# Patient Record
Sex: Male | Born: 1965 | Race: Black or African American | Hispanic: No | Marital: Single | Smoking: Former smoker
Health system: Southern US, Community
[De-identification: ages and names within clinical notes are randomized; demographics above are authoritative.]

## PROBLEM LIST (undated history)

## (undated) DIAGNOSIS — I10 Essential (primary) hypertension: Secondary | ICD-10-CM

## (undated) DIAGNOSIS — E785 Hyperlipidemia, unspecified: Secondary | ICD-10-CM

## (undated) DIAGNOSIS — Z87891 Personal history of nicotine dependence: Secondary | ICD-10-CM

## (undated) DIAGNOSIS — Z8042 Family history of malignant neoplasm of prostate: Secondary | ICD-10-CM

## (undated) DIAGNOSIS — T300 Burn of unspecified body region, unspecified degree: Secondary | ICD-10-CM

## (undated) HISTORY — DX: Hyperlipidemia, unspecified: E78.5

## (undated) HISTORY — DX: Family history of malignant neoplasm of prostate: Z80.42

## (undated) HISTORY — DX: Personal history of nicotine dependence: Z87.891

## (undated) HISTORY — DX: Burn of unspecified body region, unspecified degree: T30.0

## (undated) HISTORY — PX: SKIN GRAFT: SHX250

---

## 2002-03-22 ENCOUNTER — Emergency Department (HOSPITAL_COMMUNITY): Admission: EM | Admit: 2002-03-22 | Discharge: 2002-03-23 | Payer: Self-pay | Admitting: Emergency Medicine

## 2002-03-23 ENCOUNTER — Encounter: Payer: Self-pay | Admitting: Emergency Medicine

## 2002-03-26 ENCOUNTER — Emergency Department (HOSPITAL_COMMUNITY): Admission: EM | Admit: 2002-03-26 | Discharge: 2002-03-26 | Payer: Self-pay | Admitting: Emergency Medicine

## 2003-07-10 ENCOUNTER — Emergency Department (HOSPITAL_COMMUNITY): Admission: EM | Admit: 2003-07-10 | Discharge: 2003-07-10 | Payer: Self-pay | Admitting: Emergency Medicine

## 2004-08-11 ENCOUNTER — Emergency Department (HOSPITAL_COMMUNITY): Admission: EM | Admit: 2004-08-11 | Discharge: 2004-08-11 | Payer: Self-pay | Admitting: *Deleted

## 2005-09-09 ENCOUNTER — Emergency Department (HOSPITAL_COMMUNITY): Admission: EM | Admit: 2005-09-09 | Discharge: 2005-09-09 | Payer: Self-pay | Admitting: Emergency Medicine

## 2005-09-19 ENCOUNTER — Ambulatory Visit: Payer: Self-pay | Admitting: Family Medicine

## 2005-09-20 ENCOUNTER — Ambulatory Visit: Payer: Self-pay | Admitting: *Deleted

## 2012-09-12 ENCOUNTER — Encounter (HOSPITAL_COMMUNITY): Payer: Self-pay | Admitting: *Deleted

## 2012-09-12 ENCOUNTER — Emergency Department (HOSPITAL_COMMUNITY)
Admission: EM | Admit: 2012-09-12 | Discharge: 2012-09-12 | Disposition: A | Discharge status: Home / Self Care | Payer: BC Managed Care – PPO | Attending: Emergency Medicine | Admitting: Emergency Medicine

## 2012-09-12 DIAGNOSIS — R221 Localized swelling, mass and lump, neck: Secondary | ICD-10-CM | POA: Insufficient documentation

## 2012-09-12 DIAGNOSIS — R220 Localized swelling, mass and lump, head: Secondary | ICD-10-CM | POA: Insufficient documentation

## 2012-09-12 DIAGNOSIS — K047 Periapical abscess without sinus: Secondary | ICD-10-CM

## 2012-09-12 DIAGNOSIS — K029 Dental caries, unspecified: Secondary | ICD-10-CM | POA: Insufficient documentation

## 2012-09-12 DIAGNOSIS — F17200 Nicotine dependence, unspecified, uncomplicated: Secondary | ICD-10-CM | POA: Insufficient documentation

## 2012-09-12 DIAGNOSIS — I10 Essential (primary) hypertension: Secondary | ICD-10-CM | POA: Insufficient documentation

## 2012-09-12 DIAGNOSIS — J3489 Other specified disorders of nose and nasal sinuses: Secondary | ICD-10-CM | POA: Insufficient documentation

## 2012-09-12 DIAGNOSIS — R0981 Nasal congestion: Secondary | ICD-10-CM

## 2012-09-12 HISTORY — DX: Essential (primary) hypertension: I10

## 2012-09-12 MED ORDER — FLUTICASONE PROPIONATE 50 MCG/ACT NA SUSP: 2.0000 | Freq: Every day | NASAL | Status: DC

## 2012-09-12 MED ORDER — HYDROCODONE-ACETAMINOPHEN 5-325 MG PO TABS: 1.0000 | ORAL_TABLET | ORAL | Status: DC | PRN

## 2012-09-12 MED ORDER — PENICILLIN V POTASSIUM 500 MG PO TABS: 500.0000 mg | ORAL_TABLET | Freq: Four times a day (QID) | ORAL | Status: AC

## 2012-09-12 NOTE — ED Notes (Signed)
Pt c/o pain in nasal passages; states used nasal spray yesterday and woke up with increased swelling and pain upper lip

## 2012-09-12 NOTE — ED Provider Notes (Signed)
History    This chart was scribed for a non-physician practitioner working with Hurman Horn, MD by Jiles Prows, ED scribe. This patient was seen in room WA08/WA08 and the patient's care was started at 11:22 PM.  CSN: [id number] Arrival date & time 09/12/12  2259   Chief Complaint  Patient presents with  . Facial Swelling  . Dental Pain   The history is provided by the patient, medical records and the spouse. No language interpreter was used.   HPI Comments: Joel Allen is a 47 y.o. male who presents to the Emergency Department complaining of upper lip and nasal swelling since yesterday.  Reports that he used an OTC nasal spray for congestion yesterday before going to bed, and when he woke up today, his nose, upper lip and gums felt swollen and painful.  Pt reports he has a few upper teeth that are "bad" and need to come out.  Not currently established with a dentist.  No fevers, sweats, chills.  Has taken percocet without relief of sx.  Past Medical History  Diagnosis Date  . Hypertension    History reviewed. No pertinent past surgical history. No family history on file. History  Substance Use Topics  . Smoking status: Current Every Day Smoker -- 0.50 packs/day    Types: Cigarettes  . Smokeless tobacco: Not on file  . Alcohol Use: No    Review of Systems  HENT: Positive for facial swelling.   All other systems reviewed and are negative.    Allergies  Review of patient's allergies indicates no known allergies.  Home Medications  No current outpatient prescriptions on file.  BP 172/101  Pulse 60  Temp(Src) 98.1 F (36.7 C)  Resp 20  SpO2 98%  Physical Exam  Nursing note and vitals reviewed. Constitutional: He is oriented to person, place, and time. He appears well-developed and well-nourished.  Poor dentition.  HENT:  Head: Normocephalic and atraumatic. No trismus in the jaw.  Right Ear: Tympanic membrane, external ear and ear canal normal.  Left Ear: Tympanic  membrane, external ear and ear canal normal.  Nose: Mucosal edema present.  Mouth/Throat: Uvula is midline, oropharynx is clear and moist and mucous membranes are normal. No oral lesions. Abnormal dentition. Dental abscesses and dental caries present. No edematous. No oropharyngeal exudate, posterior oropharyngeal edema, posterior oropharyngeal erythema or tonsillar abscesses.  Turbinates swollen and erythematous; teeth largely in poor dentition, both central canines broken with dental carries and dental abscesses present; no oropharyngeal edema or erythema, no PTA, airway patent, handling secretions appropriately, speaking in fully complete sentences without difficulty  Eyes: Conjunctivae and EOM are normal. Pupils are equal, round, and reactive to light.  Neck: Normal range of motion. Neck supple.  Cardiovascular: Normal rate, regular rhythm and normal heart sounds.   Pulmonary/Chest: Effort normal and breath sounds normal. No respiratory distress.  Musculoskeletal: Normal range of motion.  Neurological: He is alert and oriented to person, place, and time.  Skin: Skin is warm and dry.  Psychiatric: He has a normal mood and affect.    ED Course  Procedures (including critical care time) DIAGNOSTIC STUDIES: Oxygen Saturation is 98% on RA, normal by my interpretation.    COORDINATION OF CARE: 11:25 PM - Discussed ED treatment with pt at bedside including pain management, nasal spray, antibiotics, and follow up with dentist for tooth extraction and pt agrees.   Labs Reviewed - No data to display No results found.  1. Dental abscess   2. Nasal  congestion     MDM   Both central canines with severe dental carries and dental abscesses which is causing pain and swelling of upper gums/lip- do not believe it is due to the nasal spray.  Turbinates are swollen and erythematous consistent with nasal congestion-- no facial swelling or airway compromise.  Rx flonase, pen VK, vicodin.  FU with  dentist- Dr. Leanord Asal.  Discussed plan with pt, he agreed.  Return precautions advised.  I personally performed the services described in this documentation, which was scribed in my presence. The recorded information has been reviewed and is accurate.    Garlon Hatchet, PA-C 09/12/12 2358

## 2012-09-13 NOTE — ED Provider Notes (Signed)
Medical screening examination/treatment/procedure(s) were performed by non-physician practitioner and as supervising physician I was immediately available for consultation/collaboration.  Hurman Horn, MD 09/13/12 [phone]

## 2012-11-01 ENCOUNTER — Encounter (HOSPITAL_COMMUNITY): Payer: Self-pay | Admitting: *Deleted

## 2012-11-01 ENCOUNTER — Emergency Department (HOSPITAL_COMMUNITY)
Admission: EM | Admit: 2012-11-01 | Discharge: 2012-11-01 | Disposition: A | Discharge status: Home / Self Care | Payer: BC Managed Care – PPO | Attending: Emergency Medicine | Admitting: Emergency Medicine

## 2012-11-01 DIAGNOSIS — T3111 Burns involving 10-19% of body surface with 10-19% third degree burns: Secondary | ICD-10-CM | POA: Insufficient documentation

## 2012-11-01 DIAGNOSIS — T2027XA Burn of second degree of neck, initial encounter: Secondary | ICD-10-CM | POA: Insufficient documentation

## 2012-11-01 DIAGNOSIS — F17200 Nicotine dependence, unspecified, uncomplicated: Secondary | ICD-10-CM | POA: Insufficient documentation

## 2012-11-01 DIAGNOSIS — Z23 Encounter for immunization: Secondary | ICD-10-CM | POA: Insufficient documentation

## 2012-11-01 DIAGNOSIS — T3110 Burns involving 10-19% of body surface with 0% to 9% third degree burns: Secondary | ICD-10-CM | POA: Insufficient documentation

## 2012-11-01 DIAGNOSIS — W401XXA Explosion of explosive gases, initial encounter: Secondary | ICD-10-CM | POA: Insufficient documentation

## 2012-11-01 DIAGNOSIS — T22219A Burn of second degree of unspecified forearm, initial encounter: Secondary | ICD-10-CM | POA: Insufficient documentation

## 2012-11-01 DIAGNOSIS — Y929 Unspecified place or not applicable: Secondary | ICD-10-CM | POA: Insufficient documentation

## 2012-11-01 DIAGNOSIS — Y9389 Activity, other specified: Secondary | ICD-10-CM | POA: Insufficient documentation

## 2012-11-01 DIAGNOSIS — I10 Essential (primary) hypertension: Secondary | ICD-10-CM | POA: Insufficient documentation

## 2012-11-01 MED ORDER — SILVER SULFADIAZINE 1 % EX CREA
TOPICAL_CREAM | Freq: Once | CUTANEOUS | Status: AC
  Administered 2012-11-01: 1 via TOPICAL
  Filled 2012-11-01: qty 50

## 2012-11-01 MED ORDER — TETANUS-DIPHTH-ACELL PERTUSSIS 5-2.5-18.5 LF-MCG/0.5 IM SUSP
0.5000 mL | Freq: Once | INTRAMUSCULAR | Status: AC
  Administered 2012-11-01: 0.5 mL via INTRAMUSCULAR
  Filled 2012-11-01: qty 0.5

## 2012-11-01 MED ORDER — SILVER SULFADIAZINE 1 % EX CREA: TOPICAL_CREAM | Freq: Once | CUTANEOUS | Status: DC

## 2012-11-01 MED ORDER — HYDROCODONE-ACETAMINOPHEN 5-325 MG PO TABS: 1.0000 | ORAL_TABLET | Freq: Four times a day (QID) | ORAL | Status: DC | PRN

## 2012-11-01 MED ORDER — HYDROMORPHONE HCL PF 1 MG/ML IJ SOLN
1.0000 mg | INTRAMUSCULAR | Status: DC | PRN
  Administered 2012-11-01: 1 mg via INTRAMUSCULAR
  Filled 2012-11-01: qty 1
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
## 2012-11-01 NOTE — Progress Notes (Signed)
Patient confirms that she has [facility] but does not have a pcp.  Patient reports that [employer] has sent him a list of pcps but he has not had the oppurtunity to pick one yet and promises he will.  EDCM also informed patient that he can call the phone number on the back of his insurance card to help him find a doctor who is close to him and who is within network.  Patient verbalized understanding.  No further needs at this time.
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
## 2012-11-08 ENCOUNTER — Encounter (HOSPITAL_COMMUNITY): Payer: Self-pay | Admitting: Emergency Medicine

## 2012-11-08 ENCOUNTER — Emergency Department (HOSPITAL_COMMUNITY)
Admission: EM | Admit: 2012-11-08 | Discharge: 2012-11-08 | Disposition: A | Discharge status: Home / Self Care | Payer: BC Managed Care – PPO | Source: Home / Self Care

## 2012-11-08 DIAGNOSIS — T3111 Burns involving 10-19% of body surface with 10-19% third degree burns: Secondary | ICD-10-CM

## 2012-11-08 MED ORDER — SILVER SULFADIAZINE 1 % EX CREA: TOPICAL_CREAM | Freq: Every day | CUTANEOUS | Status: DC

## 2012-11-08 MED ORDER — HYDROCODONE-ACETAMINOPHEN 5-325 MG PO TABS: 1.0000 | ORAL_TABLET | Freq: Four times a day (QID) | ORAL | Status: DC | PRN
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
## 2012-11-10 NOTE — ED Provider Notes (Signed)
Medical screening examination/treatment/procedure(s) were performed by a resident physician or non-physician practitioner and as the supervising physician I was immediately available for consultation/collaboration.  Jahara Dail, MD   Renda Pohlman S Edman Lipsey, MD 11/10/12 0816 

## 2012-11-13 DIAGNOSIS — L299 Pruritus, unspecified: Secondary | ICD-10-CM | POA: Insufficient documentation

## 2012-11-13 DIAGNOSIS — F17200 Nicotine dependence, unspecified, uncomplicated: Secondary | ICD-10-CM | POA: Insufficient documentation

## 2012-11-13 DIAGNOSIS — T3110 Burns involving 10-19% of body surface with 0% to 9% third degree burns: Secondary | ICD-10-CM | POA: Insufficient documentation

## 2013-06-26 ENCOUNTER — Emergency Department (HOSPITAL_COMMUNITY)
Admission: EM | Admit: 2013-06-26 | Discharge: 2013-06-26 | Disposition: A | Discharge status: Home / Self Care | Payer: BC Managed Care – PPO | Source: Home / Self Care | Attending: Emergency Medicine | Admitting: Emergency Medicine

## 2013-06-26 ENCOUNTER — Encounter (HOSPITAL_COMMUNITY): Payer: Self-pay | Admitting: Emergency Medicine

## 2013-06-26 DIAGNOSIS — L280 Lichen simplex chronicus: Secondary | ICD-10-CM

## 2013-06-26 MED ORDER — BETAMETHASONE DIPROPIONATE AUG 0.05 % EX CREA: TOPICAL_CREAM | Freq: Two times a day (BID) | CUTANEOUS | Status: DC

## 2013-06-26 MED ORDER — HYDROXYZINE HCL 25 MG PO TABS: 25.0000 mg | ORAL_TABLET | Freq: Four times a day (QID) | ORAL | Status: DC

## 2013-06-26 NOTE — Discharge Instructions (Signed)
Eczema has been described as "the itch that rashes."  The primary problem is inflammation of the skin, this is followed by the irresistible urge to scratch.  The scratching is what causes the rash.   ° °Eczema is thought to be hereditary.  It is often accompanied by other inflammatory diseases such as asthma or hay fever.  There are certain environmental things that may make it worse such as dryness of the skin, wool clothing, infection, certain allergens, and stress.  It is important to recognize that it is not caused by stress, and the search for an allergic cause is not helpful. ° °While there is no cure for eczema, it can be controlled with prescription medication and lifestyle changes. Suggestions follow for successful control of eczema: ° °· Avoid harsh soaps.  Use Dove, Cetaphil, Neutrogena, Aveeno.  Do not use Ivory. °· Take infrequent baths or showers, use luke warm water.  Get in and out of the bath or shower as quickly as possible.  Do not scrub the skin. Pat the skin dry after bathing.   °· Immediately after bathing apply a skin moisturizer such as Aquaphor, Vaseline, Eucerin, Cetaphil, Nutraderm, or Nivea. These should be applied 2 times a day, immediately after bathing and after all hand washing.  °· Use unscented laundry detergent such as Cheer-free, All-free, or unscented Bounce since these have no perfumes or preservatives.  °· Keep thermostat as low as possible in winter.  Consider a humidifier in bedroom.   °· Avoid wool clothing, blended or synthetic fabric or shirt collar tags--use  100% cotton clothing instead.  °· Avoid scratching.  Try using an ice cube on  Itchy area.  °· May use antihistamines such as Benadryl, Zyrtec, Allegra, or Claritin for itching.  °· Treat skin infections immediately. °· Eczema is a chronic and recurring condition.  You should be followed by a primary care doctor or a dermatology specialist or both.  ° ° ° °

## 2013-06-26 NOTE — ED Notes (Signed)
Called [facility] on [address] to cancel meds Dr. Lorenz CoasterKeller E-prescribed Per pt, wants meds to go to [facility] ([address]); called med in
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
## 2013-06-26 NOTE — ED Notes (Signed)
Pt c/o rash on both arms, chest onset x1 month Denies f/v/n/d  Alert w/no signs of acute distress.

## 2015-03-18 ENCOUNTER — Encounter: Payer: Self-pay | Admitting: Medical

## 2015-03-18 ENCOUNTER — Ambulatory Visit (INDEPENDENT_AMBULATORY_CARE_PROVIDER_SITE_OTHER): Payer: BLUE CROSS/BLUE SHIELD | Admitting: Medical

## 2015-03-18 VITALS — BP 150/100 | HR 81 | Wt 151.0 lb

## 2015-03-18 DIAGNOSIS — F17200 Nicotine dependence, unspecified, uncomplicated: Secondary | ICD-10-CM | POA: Diagnosis not present

## 2015-03-18 DIAGNOSIS — R51 Headache: Secondary | ICD-10-CM

## 2015-03-18 DIAGNOSIS — R351 Nocturia: Secondary | ICD-10-CM

## 2015-03-18 DIAGNOSIS — G479 Sleep disorder, unspecified: Secondary | ICD-10-CM | POA: Diagnosis not present

## 2015-03-18 DIAGNOSIS — I10 Essential (primary) hypertension: Secondary | ICD-10-CM | POA: Diagnosis not present

## 2015-03-18 DIAGNOSIS — R3581 Nocturnal polyuria: Secondary | ICD-10-CM

## 2015-03-18 DIAGNOSIS — R519 Headache, unspecified: Secondary | ICD-10-CM

## 2015-03-18 DIAGNOSIS — R0602 Shortness of breath: Secondary | ICD-10-CM | POA: Diagnosis not present

## 2015-03-18 DIAGNOSIS — Z8042 Family history of malignant neoplasm of prostate: Secondary | ICD-10-CM | POA: Diagnosis not present

## 2015-03-18 LAB — CBC
HEMATOCRIT: 48.2 % (ref 39.0–52.0)
HEMOGLOBIN: 16 g/dL (ref 13.0–17.0)
MCH: 26.7 pg (ref 26.0–34.0)
MCHC: 33.2 g/dL (ref 30.0–36.0)
MCV: 80.3 fL (ref 78.0–100.0)
MPV: 10.1 fL (ref 8.6–12.4)
Platelets: 280 10*3/uL (ref 150–400)
RBC: 6 MIL/uL — ABNORMAL HIGH (ref 4.22–5.81)
RDW: 14.8 % (ref 11.5–15.5)
WBC: 6.6 10*3/uL (ref 4.0–10.5)

## 2015-03-18 LAB — POCT URINALYSIS DIPSTICK
Bilirubin, UA: NEGATIVE
Blood, UA: NEGATIVE
GLUCOSE UA: NEGATIVE
Ketones, UA: NEGATIVE
LEUKOCYTES UA: NEGATIVE
Nitrite, UA: NEGATIVE
PROTEIN UA: NEGATIVE
Spec Grav, UA: >=1.03
UROBILINOGEN UA: NEGATIVE
pH, UA: 5

## 2015-03-18 LAB — COMPREHENSIVE METABOLIC PANEL
ALBUMIN: 4.2 g/dL (ref 3.6–5.1)
ALT: 16 U/L (ref 9–46)
AST: 18 U/L (ref 10–35)
Alkaline Phosphatase: 102 U/L (ref 40–115)
BUN: 15 mg/dL (ref 7–25)
CHLORIDE: 106 mmol/L (ref 98–110)
CO2: 26 mmol/L (ref 20–31)
CREATININE: 0.94 mg/dL (ref 0.70–1.33)
Calcium: 9.5 mg/dL (ref 8.6–10.3)
Glucose, Bld: 80 mg/dL (ref 65–99)
POTASSIUM: 3.9 mmol/L (ref 3.5–5.3)
SODIUM: 141 mmol/L (ref 135–146)
TOTAL PROTEIN: 7.3 g/dL (ref 6.1–8.1)
Total Bilirubin: 0.5 mg/dL (ref 0.2–1.2)

## 2015-03-18 LAB — TSH: TSH: 0.94 u[IU]/mL (ref 0.350–4.500)

## 2015-03-18 NOTE — Progress Notes (Signed)
Subjective: Chief Complaint  Patient presents with  . New Patient (Initial Visit)    taking nugenix, said it doesnt help. said that he is short of breath but thinks it is from smoking  . Headache    couple weeks. taken bc powders for it.    Here as a new patient.  Was going to urgent care prior.  Been having headaches the last 2 weeks.   Sometimes gets up to use the bathroom in the middle of the night and will have a bad headache.  Wonders about his prostate making him get up to urinate.  Was taking some OTC Beta Prostate.  Took this for a few months, then stopped this.   He notes if standing at a ball game, will gets some mid to low back pain, sometimes will cause his eyes to get red.    Has had back issues like this for years.  If he walks a mile, gets a lot of back pain.  Currently a smoker, 0.5 ppd.  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  BP is elevated today, and he notes that he doesn't check BPs now but has used [facility] BP machine in the past but not recently.  Prior to 2 weeks ago was having periodic headaches, but lately having a lot of headaches.  Doesn't necessarily eat but 1-2 times daily.  getting headaches most days per week, twice daily.   Uses some BC powders.   Gets headaches frontal.  Doesn't sleep well.  Has used some Zquil.  No witnessed apnea or snoring.  Does feel a lot of fatigue lately.  Not sleeping well of late.   Gets to sleep ok, but awakes often at night due to urination.  On average gets up once nightly to urinate.  During the day urine is ok, but in the wee hours of the night, has to push and strain for urine to come out.   Wants to quit tobacco. Not drinking much caffeine.  Does occasionally get headache that awakes him from sleep.   No headaches with activity or sex.  His mother passed away in Jan 04, 2023 with lymphoma.  No other aggravating or relieving factors. No other complaint.  Lives with fiance.     Past Medical History  Diagnosis  Date  . Hypertension    ROS as in subjective   Objective: BP 150/100 mmHg  Pulse 81  Wt 151 lb (68.493 kg)  General appearance: alert, no distress, WD/WN, lean AA male HEENT: normocephalic, sclerae anicteric, PERRLA, EOMi, nares patent, no discharge or erythema, pharynx normal Oral cavity: MMM, left upper molar with decay, missing several upper teeth Neck: supple, no lymphadenopathy, no thyromegaly, no masses, no bruits Heart: RRR, normal S1, S2, no murmurs Lungs: CTA bilaterally, no wheezes, rhonchi, or rales Abdomen: +bs, soft, no bruits, non tender, non distended, no masses, no hepatomegaly, no splenomegaly Extremities: no edema, no cyanosis, no clubbing Pulses: 2+ symmetric, upper and lower extremities, normal cap refill Neurological: alert, oriented x 3, CN2-12 intact, strength normal upper extremities and lower extremities, sensation normal throughout, DTRs 2+ throughout, no cerebellar signs, gait normal Psychiatric: normal affect, behavior normal, pleasant    Adult ECG Report  Indication: SOB, elevated BP  Rate: 68 bpm  Rhythm: normal sinus rhythm  QRS Axis: 51 degrees  PR Interval:  QRS Duration: 86ms  QTc:  Conduction Disturbances: minimal voltage criteria for LVH  Other Abnormalities: none  Patient's cardiac  risk factors are: hypertension, male gender and smoking/ tobacco exposure.  EKG comparison: none  Narrative Interpretation: possible LVH, otherwise normal EKG    Assessment: Encounter Diagnoses  Name Primary?  . Essential hypertension Yes  . Acute intractable headache, unspecified headache type   . Tobacco use disorder   . Sleep disturbance   . Nocturnal polyuria   . SOB (shortness of breath)   . Family history of prostate cancer      Plan: Discussed his many symptoms and concerns, most importantly headaches and SOB.   Discussed differential for his symptoms, some may or may not be related.  EKG reviewed.  Labs today, will send for CXR.     Will likely need to do PFTs next visit, prostate exam.   F/u pending studies.

## 2015-03-19 ENCOUNTER — Ambulatory Visit
Admission: RE | Admit: 2015-03-19 | Discharge: 2015-03-19 | Disposition: A | Discharge status: Home / Self Care | Payer: BLUE CROSS/BLUE SHIELD | Source: Ambulatory Visit | Attending: Medical | Admitting: Medical

## 2015-03-19 DIAGNOSIS — R351 Nocturia: Secondary | ICD-10-CM

## 2015-03-19 DIAGNOSIS — R51 Headache: Secondary | ICD-10-CM

## 2015-03-19 DIAGNOSIS — R3581 Nocturnal polyuria: Secondary | ICD-10-CM

## 2015-03-19 DIAGNOSIS — I10 Essential (primary) hypertension: Secondary | ICD-10-CM

## 2015-03-19 DIAGNOSIS — R0602 Shortness of breath: Secondary | ICD-10-CM

## 2015-03-19 DIAGNOSIS — G479 Sleep disorder, unspecified: Secondary | ICD-10-CM

## 2015-03-19 DIAGNOSIS — F17200 Nicotine dependence, unspecified, uncomplicated: Secondary | ICD-10-CM

## 2015-03-19 DIAGNOSIS — R519 Headache, unspecified: Secondary | ICD-10-CM

## 2015-03-19 LAB — PSA: PSA: 0.23 ng/mL (ref <=4.00)

## 2015-03-20 ENCOUNTER — Other Ambulatory Visit: Payer: Self-pay | Admitting: Medical

## 2015-04-01 ENCOUNTER — Encounter: Payer: Self-pay | Admitting: Medical

## 2015-04-01 ENCOUNTER — Ambulatory Visit (INDEPENDENT_AMBULATORY_CARE_PROVIDER_SITE_OTHER): Payer: BLUE CROSS/BLUE SHIELD | Admitting: Medical

## 2015-04-01 VITALS — BP 128/90 | HR 91 | Ht 70.0 in | Wt 147.0 lb

## 2015-04-01 DIAGNOSIS — I10 Essential (primary) hypertension: Secondary | ICD-10-CM

## 2015-04-01 DIAGNOSIS — Z8042 Family history of malignant neoplasm of prostate: Secondary | ICD-10-CM | POA: Diagnosis not present

## 2015-04-01 DIAGNOSIS — R0602 Shortness of breath: Secondary | ICD-10-CM

## 2015-04-01 DIAGNOSIS — Z0000 Encounter for general adult medical examination without abnormal findings: Secondary | ICD-10-CM | POA: Diagnosis not present

## 2015-04-01 DIAGNOSIS — F17200 Nicotine dependence, unspecified, uncomplicated: Secondary | ICD-10-CM

## 2015-04-01 DIAGNOSIS — Z125 Encounter for screening for malignant neoplasm of prostate: Secondary | ICD-10-CM | POA: Insufficient documentation

## 2015-04-01 DIAGNOSIS — Z113 Encounter for screening for infections with a predominantly sexual mode of transmission: Secondary | ICD-10-CM | POA: Diagnosis not present

## 2015-04-01 DIAGNOSIS — Z23 Encounter for immunization: Secondary | ICD-10-CM

## 2015-04-01 DIAGNOSIS — R51 Headache: Secondary | ICD-10-CM

## 2015-04-01 DIAGNOSIS — Z1211 Encounter for screening for malignant neoplasm of colon: Secondary | ICD-10-CM | POA: Insufficient documentation

## 2015-04-01 DIAGNOSIS — Z1159 Encounter for screening for other viral diseases: Secondary | ICD-10-CM

## 2015-04-01 DIAGNOSIS — R519 Headache, unspecified: Secondary | ICD-10-CM

## 2015-04-01 LAB — POCT URINALYSIS DIPSTICK
BILIRUBIN UA: NEGATIVE
GLUCOSE UA: NEGATIVE
Ketones, UA: NEGATIVE
LEUKOCYTES UA: NEGATIVE
NITRITE UA: NEGATIVE
PH UA: 5
Protein, UA: NEGATIVE
Spec Grav, UA: >=1.03
UROBILINOGEN UA: NEGATIVE

## 2015-04-01 MED ORDER — MOMETASONE FUROATE 220 MCG/INH IN AEPB
1.0000 | INHALATION_SPRAY | Freq: Every day | RESPIRATORY_TRACT | Status: DC
Start: 2015-04-01 — End: 2015-05-06

## 2015-04-01 MED ORDER — LOSARTAN POTASSIUM 25 MG PO TABS
25.0000 mg | ORAL_TABLET | Freq: Every day | ORAL | Status: DC
Start: 2015-04-01 — End: 2015-05-06

## 2015-04-01 MED ORDER — MOMETASONE FUROATE 220 MCG/INH IN AEPB: 2.0000 | INHALATION_SPRAY | Freq: Every day | RESPIRATORY_TRACT | Status: DC

## 2015-04-01 MED ORDER — ALBUTEROL SULFATE 108 (90 BASE) MCG/ACT IN AEPB
2.0000 | INHALATION_SPRAY | Freq: Four times a day (QID) | RESPIRATORY_TRACT | Status: DC | PRN
Start: 2015-04-01 — End: 2015-05-06
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
## 2015-04-02 NOTE — Addendum Note (Signed)
Addended by: Kieth Brightly on: 04/02/2015 10:53 AM   Modules accepted: Kipp Brood

## 2015-04-10 ENCOUNTER — Other Ambulatory Visit: Payer: BLUE CROSS/BLUE SHIELD

## 2015-04-10 DIAGNOSIS — I10 Essential (primary) hypertension: Secondary | ICD-10-CM

## 2015-04-10 DIAGNOSIS — Z0000 Encounter for general adult medical examination without abnormal findings: Secondary | ICD-10-CM

## 2015-04-10 DIAGNOSIS — Z113 Encounter for screening for infections with a predominantly sexual mode of transmission: Secondary | ICD-10-CM

## 2015-04-10 DIAGNOSIS — Z1159 Encounter for screening for other viral diseases: Secondary | ICD-10-CM

## 2015-04-10 LAB — LIPID PANEL
CHOLESTEROL: 234 mg/dL — AB (ref 125–200)
HDL: 45 mg/dL (ref >=40)
LDL Cholesterol: 172 mg/dL — ABNORMAL HIGH (ref <130)
TRIGLYCERIDES: 84 mg/dL (ref <150)
Total CHOL/HDL Ratio: 5.2 Ratio — ABNORMAL HIGH (ref <=5.0)
VLDL: 17 mg/dL (ref <30)

## 2015-04-11 LAB — HEPATITIS C ANTIBODY: HCV AB: NEGATIVE

## 2015-04-11 LAB — GC/CHLAMYDIA PROBE AMP
CT Probe RNA: NOT DETECTED
GC PROBE AMP APTIMA: NOT DETECTED

## 2015-04-11 LAB — RPR

## 2015-04-11 LAB — HIV ANTIBODY (ROUTINE TESTING W REFLEX): HIV 1&2 Ab, 4th Generation: NONREACTIVE

## 2015-04-13 ENCOUNTER — Encounter: Payer: Self-pay | Admitting: Gastroenterology

## 2015-04-17 ENCOUNTER — Other Ambulatory Visit (INDEPENDENT_AMBULATORY_CARE_PROVIDER_SITE_OTHER): Payer: BLUE CROSS/BLUE SHIELD

## 2015-04-17 DIAGNOSIS — R3129 Other microscopic hematuria: Secondary | ICD-10-CM | POA: Diagnosis not present

## 2015-04-17 LAB — POCT URINALYSIS DIPSTICK
BILIRUBIN UA: NEGATIVE
Glucose, UA: NEGATIVE
KETONES UA: NEGATIVE
LEUKOCYTES UA: NEGATIVE
NITRITE UA: NEGATIVE
PH UA: 6
PROTEIN UA: NEGATIVE
RBC UA: NEGATIVE
Spec Grav, UA: >=1.03
Urobilinogen, UA: NEGATIVE

## 2015-05-06 ENCOUNTER — Ambulatory Visit (INDEPENDENT_AMBULATORY_CARE_PROVIDER_SITE_OTHER): Payer: BLUE CROSS/BLUE SHIELD | Admitting: Medical

## 2015-05-06 ENCOUNTER — Encounter: Payer: Self-pay | Admitting: Medical

## 2015-05-06 ENCOUNTER — Ambulatory Visit: Payer: Self-pay | Admitting: Medical

## 2015-05-06 ENCOUNTER — Telehealth: Payer: Self-pay

## 2015-05-06 VITALS — BP 120/80 | HR 86 | Wt 147.0 lb

## 2015-05-06 DIAGNOSIS — Z87891 Personal history of nicotine dependence: Secondary | ICD-10-CM | POA: Diagnosis not present

## 2015-05-06 DIAGNOSIS — R0602 Shortness of breath: Secondary | ICD-10-CM

## 2015-05-06 DIAGNOSIS — I10 Essential (primary) hypertension: Secondary | ICD-10-CM

## 2015-05-06 DIAGNOSIS — R519 Headache, unspecified: Secondary | ICD-10-CM

## 2015-05-06 DIAGNOSIS — R51 Headache: Secondary | ICD-10-CM

## 2015-05-06 MED ORDER — LOSARTAN POTASSIUM 25 MG PO TABS: 25.0000 mg | ORAL_TABLET | Freq: Every day | ORAL | Status: DC

## 2015-05-06 MED ORDER — MOMETASONE FUROATE 200 MCG/ACT IN AERO: 2.0000 | INHALATION_SPRAY | Freq: Every day | RESPIRATORY_TRACT | Status: DC
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
## 2015-05-06 NOTE — Telephone Encounter (Signed)
Pt is aware that he missed his appt and moved it to 10am

## 2015-05-11 ENCOUNTER — Ambulatory Visit (AMBULATORY_SURGERY_CENTER): Payer: BLUE CROSS/BLUE SHIELD | Admitting: *Deleted

## 2015-05-11 VITALS — Ht 71.0 in | Wt 148.0 lb

## 2015-05-11 DIAGNOSIS — Z1211 Encounter for screening for malignant neoplasm of colon: Secondary | ICD-10-CM

## 2015-05-11 NOTE — Progress Notes (Signed)
Patient denies any allergies to eggs or soy. Patient denies any problems with anesthesia/sedation. Patient denies any oxygen use at home and does not take any diet/weight loss medications. Patient declined EMMI education. 

## 2015-05-13 HISTORY — PX: COLONOSCOPY: SHX174

## 2015-05-25 ENCOUNTER — Encounter: Payer: Self-pay | Admitting: Gastroenterology

## 2015-05-25 ENCOUNTER — Ambulatory Visit (AMBULATORY_SURGERY_CENTER): Payer: BLUE CROSS/BLUE SHIELD | Admitting: Gastroenterology

## 2015-05-25 VITALS — BP 107/72 | HR 61 | Temp 97.8°F | Resp 10 | Ht 71.0 in | Wt 148.0 lb

## 2015-05-25 DIAGNOSIS — Z1211 Encounter for screening for malignant neoplasm of colon: Secondary | ICD-10-CM | POA: Diagnosis present

## 2015-05-25 MED ORDER — SODIUM CHLORIDE 0.9 % IV SOLN: 500.0000 mL | INTRAVENOUS | Status: DC

## 2015-05-25 NOTE — Progress Notes (Signed)
A/ox3 pleased with MAC, report t0 LawyerCelia RN

## 2015-05-25 NOTE — Op Note (Signed)
[facility] Patient Name: Joel Allen Procedure Date: 05/25/2015 7:20 AM MRN: [id number] Endoscopist: Sherilyn Cooter L. Myrtie Neither , MD Age: 50 Referring MD:  Date of Birth: December 04, 1965 Gender: Male Procedure:                Colonoscopy Indications:              Screening for colorectal malignant neoplasm Medicines:                Monitored Anesthesia Care Procedure:                Pre-Anesthesia Assessment:                           - Prior to the procedure, a History and Physical                            was performed, and patient medications and                            allergies were reviewed. The patient's tolerance of                            previous anesthesia was also reviewed. The risks                            and benefits of the procedure and the sedation                            options and risks were discussed with the patient.                            All questions were answered, and informed consent                            was obtained. Prior Anticoagulants: The patient has                            taken no previous anticoagulant or antiplatelet                            agents. ASA Grade Assessment: II - A patient with                            mild systemic disease. After reviewing the risks                            and benefits, the patient was deemed in                            satisfactory condition to undergo the procedure.                           After obtaining informed consent, the colonoscope  was passed under direct vision. Throughout the                            procedure, the patient's blood pressure, pulse, and                            oxygen saturations were monitored continuously. The                            Model CF-HQ190L [id number]) scope was introduced                            through the anus and advanced to the the cecum,                            identified by appendiceal orifice and  ileocecal                            valve. The colonoscopy was somewhat difficult due                            to a tortuous colon. The patient tolerated the                            procedure well. The quality of the bowel                            preparation was good (after irrigation). The                            ileocecal valve, appendiceal orifice, and rectum                            were photographed. The bowel preparation used was                            Miralax. The quality of the bowel preparation was                            evaluated using the BBPS [facility] Bowel Preparation                            Scale) with scores of: Right Colon = 2, Transverse                            Colon = 2 and Left Colon = 2. The total BBPS score                            equals 6. Scope In: 8:04:26 AM Scope Out: 8:24:56 AM Scope Withdrawal Time: 0 hours 14 minutes 5 seconds  Total Procedure Duration: 0 hours 20 minutes 30 seconds  Findings:      The perianal and digital rectal examinations were normal.  The entire examined colon appeared normal on direct and retroflexion       views. Complications:            No immediate complications. Estimated Blood Loss:     Estimated blood loss: none. Impression:               - The entire examined colon is normal on direct and                            retroflexion views.                           - No specimens collected. Recommendation:           - Patient has a contact number available for                            emergencies. The signs and symptoms of potential                            delayed complications were discussed with the                            patient. Return to normal activities tomorrow.                            Written discharge instructions were provided to the                            patient.                           - Resume previous diet.                           - Continue present  medications.                           - Repeat colonoscopy in 10 years for screening                            purposes. Procedure Code(s):        --- Professional ---                           [id number], Colonoscopy, flexible; diagnostic, including                            collection of specimen(s) by brushing or washing,                            when performed (separate procedure) CPT copyright 2016 American Medical Association. All rights reserved. Henry L. Myrtie Neitheranis, MD Starr LakeHenry L. Danis, MD 05/25/2015 8:30:23 AM Number of Addenda: 0

## 2015-05-25 NOTE — Patient Instructions (Signed)
Discharge instructions given. Normal exam. Resume previous medications. YOU HAD AN ENDOSCOPIC PROCEDURE TODAY AT THE [facility]:   Refer to the procedure report that was given to you for any specific questions about what was found during the examination.  If the procedure report does not answer your questions, please call your gastroenterologist to clarify.  If you requested that your care partner not be given the details of your procedure findings, then the procedure report has been included in a sealed envelope for you to review at your convenience later.  YOU SHOULD EXPECT: Some feelings of bloating in the abdomen. Passage of more gas than usual.  Walking can help get rid of the air that was put into your GI tract during the procedure and reduce the bloating. If you had a lower endoscopy (such as a colonoscopy or flexible sigmoidoscopy) you may notice spotting of blood in your stool or on the toilet paper. If you underwent a bowel prep for your procedure, you may not have a normal bowel movement for a few days.  Please Note:  You might notice some irritation and congestion in your nose or some drainage.  This is from the oxygen used during your procedure.  There is no need for concern and it should clear up in a day or so.  SYMPTOMS TO REPORT IMMEDIATELY:   Following lower endoscopy (colonoscopy or flexible sigmoidoscopy):  Excessive amounts of blood in the stool  Significant tenderness or worsening of abdominal pains  Swelling of the abdomen that is new, acute  Fever of 100F or higher   For urgent or emergent issues, a gastroenterologist can be reached at any hour by calling [phone].   DIET: Your first meal following the procedure should be a small meal and then it is ok to progress to your normal diet. Heavy or fried foods are harder to digest and may make you feel nauseous or bloated.  Likewise, meals heavy in dairy and vegetables can increase bloating.  Drink plenty  of fluids but you should avoid alcoholic beverages for 24 hours.  ACTIVITY:  You should plan to take it easy for the rest of today and you should NOT DRIVE or use heavy machinery until tomorrow (because of the sedation medicines used during the test).    FOLLOW UP: Our staff will call the number listed on your records the next business day following your procedure to check on you and address any questions or concerns that you may have regarding the information given to you following your procedure. If we do not reach you, we will leave a message.  However, if you are feeling well and you are not experiencing any problems, there is no need to return our call.  We will assume that you have returned to your regular daily activities without incident.  If any biopsies were taken you will be contacted by phone or by letter within the next 1-3 weeks.  Please call us at [phone] if you have not heard about the biopsies in 3 weeks.    SIGNATURES/CONFIDENTIALITY: You and/or your care partner have signed paperwork which will be entered into your electronic medical record.  These signatures attest to the fact that that the information above on your After Visit Summary has been reviewed and is understood.  Full responsibility of the confidentiality of this discharge information lies with you and/or your care-partner. 

## 2015-05-26 ENCOUNTER — Telehealth: Payer: Self-pay

## 2015-05-26 NOTE — Telephone Encounter (Signed)
Left message on answering machine. 

## 2015-06-26 ENCOUNTER — Other Ambulatory Visit: Payer: Self-pay | Admitting: Medical

## 2016-01-28 ENCOUNTER — Encounter: Payer: Self-pay | Admitting: Medical

## 2016-01-28 ENCOUNTER — Ambulatory Visit
Admission: RE | Admit: 2016-01-28 | Discharge: 2016-01-28 | Disposition: A | Discharge status: Home / Self Care | Payer: BLUE CROSS/BLUE SHIELD | Source: Ambulatory Visit | Attending: Medical | Admitting: Medical

## 2016-01-28 ENCOUNTER — Ambulatory Visit (INDEPENDENT_AMBULATORY_CARE_PROVIDER_SITE_OTHER): Payer: BLUE CROSS/BLUE SHIELD | Admitting: Medical

## 2016-01-28 VITALS — BP 108/70 | HR 78 | Wt 153.0 lb

## 2016-01-28 DIAGNOSIS — R3129 Other microscopic hematuria: Secondary | ICD-10-CM | POA: Insufficient documentation

## 2016-01-28 DIAGNOSIS — G8929 Other chronic pain: Secondary | ICD-10-CM | POA: Insufficient documentation

## 2016-01-28 DIAGNOSIS — R319 Hematuria, unspecified: Secondary | ICD-10-CM | POA: Diagnosis not present

## 2016-01-28 DIAGNOSIS — F17200 Nicotine dependence, unspecified, uncomplicated: Secondary | ICD-10-CM

## 2016-01-28 DIAGNOSIS — M545 Low back pain, unspecified: Secondary | ICD-10-CM

## 2016-01-28 LAB — POCT UA - MICROSCOPIC ONLY
Bacteria, U Microscopic: NEGATIVE
CASTS, UR, LPF, POC: NEGATIVE
CRYSTALS, UR, HPF, POC: NEGATIVE
Epithelial cells, urine per micros: NEGATIVE
Mucus, UA: NEGATIVE
RBC, urine, microscopic: NEGATIVE
WBC, Ur, HPF, POC: NEGATIVE

## 2016-01-28 LAB — POCT URINALYSIS DIPSTICK
BILIRUBIN UA: NEGATIVE
Glucose, UA: NEGATIVE
KETONES UA: NEGATIVE
LEUKOCYTES UA: NEGATIVE
Nitrite, UA: NEGATIVE
PH UA: 5.5
PROTEIN UA: NEGATIVE
RBC UA: NEGATIVE
Urobilinogen, UA: NEGATIVE
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
## 2016-02-09 ENCOUNTER — Telehealth: Payer: Self-pay

## 2016-02-09 DIAGNOSIS — M545 Low back pain: Principal | ICD-10-CM

## 2016-02-09 DIAGNOSIS — G8929 Other chronic pain: Secondary | ICD-10-CM

## 2016-02-11 NOTE — Telephone Encounter (Signed)
error 

## 2016-03-14 DIAGNOSIS — Z87891 Personal history of nicotine dependence: Secondary | ICD-10-CM

## 2016-03-14 DIAGNOSIS — I10 Essential (primary) hypertension: Secondary | ICD-10-CM

## 2016-03-14 DIAGNOSIS — E785 Hyperlipidemia, unspecified: Secondary | ICD-10-CM

## 2016-03-14 HISTORY — DX: Personal history of nicotine dependence: Z87.891

## 2016-03-14 HISTORY — DX: Essential (primary) hypertension: I10

## 2016-03-14 HISTORY — DX: Hyperlipidemia, unspecified: E78.5

## 2016-03-22 ENCOUNTER — Other Ambulatory Visit: Payer: Self-pay | Admitting: Medical

## 2016-09-30 ENCOUNTER — Other Ambulatory Visit: Payer: Self-pay | Admitting: Medical

## 2016-10-05 ENCOUNTER — Ambulatory Visit (INDEPENDENT_AMBULATORY_CARE_PROVIDER_SITE_OTHER): Payer: BLUE CROSS/BLUE SHIELD | Admitting: Medical

## 2016-10-05 ENCOUNTER — Encounter: Payer: Self-pay | Admitting: Medical

## 2016-10-05 VITALS — BP 130/84 | HR 64 | Wt 168.8 lb

## 2016-10-05 DIAGNOSIS — Z125 Encounter for screening for malignant neoplasm of prostate: Secondary | ICD-10-CM

## 2016-10-05 DIAGNOSIS — Z87891 Personal history of nicotine dependence: Secondary | ICD-10-CM | POA: Diagnosis not present

## 2016-10-05 DIAGNOSIS — I10 Essential (primary) hypertension: Secondary | ICD-10-CM

## 2016-10-05 LAB — CBC
HCT: 45.9 % (ref 38.5–50.0)
Hemoglobin: 15 g/dL (ref 13.2–17.1)
MCH: 26.4 pg — ABNORMAL LOW (ref 27.0–33.0)
MCHC: 32.7 g/dL (ref 32.0–36.0)
MCV: 80.8 fL (ref 80.0–100.0)
MPV: 9.7 fL (ref 7.5–12.5)
Platelets: 281 10*3/uL (ref 140–400)
RBC: 5.68 MIL/uL (ref 4.20–5.80)
RDW: 14.1 % (ref 11.0–15.0)
WBC: 6.1 10*3/uL (ref 4.0–10.5)

## 2016-10-05 LAB — COMPREHENSIVE METABOLIC PANEL
ALK PHOS: 100 U/L (ref 40–115)
ALT: 20 U/L (ref 9–46)
AST: 25 U/L (ref 10–35)
Albumin: 4 g/dL (ref 3.6–5.1)
BUN: 13 mg/dL (ref 7–25)
CO2: 23 mmol/L (ref 20–31)
CREATININE: 1.14 mg/dL (ref 0.70–1.33)
Calcium: 9.1 mg/dL (ref 8.6–10.3)
Chloride: 103 mmol/L (ref 98–110)
Glucose, Bld: 74 mg/dL (ref 65–99)
Potassium: 4.5 mmol/L (ref 3.5–5.3)
SODIUM: 137 mmol/L (ref 135–146)
TOTAL PROTEIN: 7.1 g/dL (ref 6.1–8.1)
Total Bilirubin: 0.5 mg/dL (ref 0.2–1.2)

## 2016-10-05 LAB — LIPID PANEL
CHOLESTEROL: 229 mg/dL — AB (ref <200)
HDL: 53 mg/dL (ref >40)
LDL Cholesterol: 156 mg/dL — ABNORMAL HIGH (ref <100)
Total CHOL/HDL Ratio: 4.3 Ratio (ref <5.0)
Triglycerides: 98 mg/dL (ref <150)
VLDL: 20 mg/dL (ref <30)
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
## 2016-10-06 ENCOUNTER — Other Ambulatory Visit: Payer: Self-pay | Admitting: Medical

## 2016-10-06 LAB — PSA: PSA: 0.5 ng/mL (ref <=4.0)

## 2016-10-06 MED ORDER — LOSARTAN POTASSIUM 25 MG PO TABS: ORAL_TABLET | ORAL | 3 refills | Status: DC

## 2016-10-06 MED ORDER — ASPIRIN EC 81 MG PO TBEC: 81.0000 mg | DELAYED_RELEASE_TABLET | Freq: Every day | ORAL | 3 refills | Status: DC

## 2016-10-06 MED ORDER — PRAVASTATIN SODIUM 20 MG PO TABS: 20.0000 mg | ORAL_TABLET | Freq: Every evening | ORAL | 1 refills | Status: DC

## 2016-10-29 ENCOUNTER — Other Ambulatory Visit: Payer: Self-pay | Admitting: Medical

## 2016-12-27 ENCOUNTER — Ambulatory Visit (INDEPENDENT_AMBULATORY_CARE_PROVIDER_SITE_OTHER): Payer: BLUE CROSS/BLUE SHIELD | Admitting: Medical

## 2016-12-27 ENCOUNTER — Encounter: Payer: Self-pay | Admitting: Medical

## 2016-12-27 VITALS — BP 126/68 | HR 67 | Wt 167.8 lb

## 2016-12-27 DIAGNOSIS — Z23 Encounter for immunization: Secondary | ICD-10-CM

## 2016-12-27 DIAGNOSIS — E785 Hyperlipidemia, unspecified: Secondary | ICD-10-CM | POA: Diagnosis not present

## 2016-12-27 DIAGNOSIS — Z87891 Personal history of nicotine dependence: Secondary | ICD-10-CM | POA: Diagnosis not present

## 2016-12-27 DIAGNOSIS — I10 Essential (primary) hypertension: Secondary | ICD-10-CM

## 2016-12-27 LAB — COMPREHENSIVE METABOLIC PANEL
AG RATIO: 1.5 (calc) (ref 1.0–2.5)
ALKALINE PHOSPHATASE (APISO): 79 U/L (ref 40–115)
ALT: 20 U/L (ref 9–46)
AST: 22 U/L (ref 10–35)
Albumin: 4.1 g/dL (ref 3.6–5.1)
BUN: 14 mg/dL (ref 7–25)
CALCIUM: 9.2 mg/dL (ref 8.6–10.3)
CHLORIDE: 104 mmol/L (ref 98–110)
CO2: 28 mmol/L (ref 20–32)
Creat: 1.18 mg/dL (ref 0.70–1.33)
GLOBULIN: 2.8 g/dL (ref 1.9–3.7)
Glucose, Bld: 86 mg/dL (ref 65–99)
Potassium: 4.3 mmol/L (ref 3.5–5.3)
Sodium: 139 mmol/L (ref 135–146)
Total Bilirubin: 0.6 mg/dL (ref 0.2–1.2)
Total Protein: 6.9 g/dL (ref 6.1–8.1)

## 2016-12-27 LAB — LIPID PANEL
CHOLESTEROL: 196 mg/dL (ref <200)
HDL: 60 mg/dL (ref >40)
LDL Cholesterol (Calc): 120 mg/dL (calc) — ABNORMAL HIGH
Non-HDL Cholesterol (Calc): 136 mg/dL (calc) — ABNORMAL HIGH (ref <130)
Total CHOL/HDL Ratio: 3.3 (calc) (ref <5.0)
Triglycerides: 67 mg/dL (ref <150)
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
## 2016-12-28 ENCOUNTER — Other Ambulatory Visit: Payer: Self-pay | Admitting: Medical

## 2016-12-28 MED ORDER — ROSUVASTATIN CALCIUM 20 MG PO TABS: 20.0000 mg | ORAL_TABLET | Freq: Every day | ORAL | 3 refills | Status: DC

## 2017-01-24 ENCOUNTER — Telehealth: Payer: Self-pay | Admitting: Medical

## 2017-01-24 NOTE — Telephone Encounter (Signed)
  Please call,  Patient called about recall on his Losartan

## 2017-01-24 NOTE — Telephone Encounter (Signed)
Joel Allen - please find out what his concern is.  There were recalls on Valsartan, but I am not aware of any recall on Losartan.  He was tolerating this medication last visit and his BP looked good.     Valsartan was recalled due to contaminants at the manufacturer.  I can certainly get on the phone when you talk to him if needed.  Vincenza HewsShane

## 2017-01-25 NOTE — Telephone Encounter (Signed)
Called and notified pt of this  

## 2017-05-15 ENCOUNTER — Telehealth: Payer: Self-pay | Admitting: Medical

## 2017-05-15 ENCOUNTER — Other Ambulatory Visit: Payer: Self-pay

## 2017-05-15 MED ORDER — LOSARTAN POTASSIUM 25 MG PO TABS: ORAL_TABLET | ORAL | 0 refills | Status: DC

## 2017-05-15 NOTE — Telephone Encounter (Signed)
Sent in refill to pharmacy.

## 2017-05-15 NOTE — Telephone Encounter (Signed)
New Message  Pt c/o medication issue:  1. Name of Medication:  losartan (Cozaar) 25 mg once daily  2. How are you currently taking this medication (dosage and times per day)?  See Above  3. Are you having a reaction (difficulty breathing--STAT)?  N/A  4. What is your medication issue?  Pt verbalized there is recall on this medication which causes cancer and he will need to prescribed something else.  Pt verbalized waiting for a call back from nurse due to he has stopped taking this medication.  Please f/u

## 2017-05-18 ENCOUNTER — Telehealth: Payer: Self-pay

## 2017-05-18 NOTE — Telephone Encounter (Signed)
Pt called about recall on losartan. Pt was advised that pharmacy would have let him know if his brand was recalled. Pt says he will continue with his med and check with pharmacy. KH

## 2017-06-13 IMAGING — CR DG CHEST 2V
2 series · 2 of 2 positions shown · non-contrast
Comparison: None.

CLINICAL DATA: Shortness of breath for 2 weeks.

EXAM:
CHEST  2 VIEW

[w chest pa]
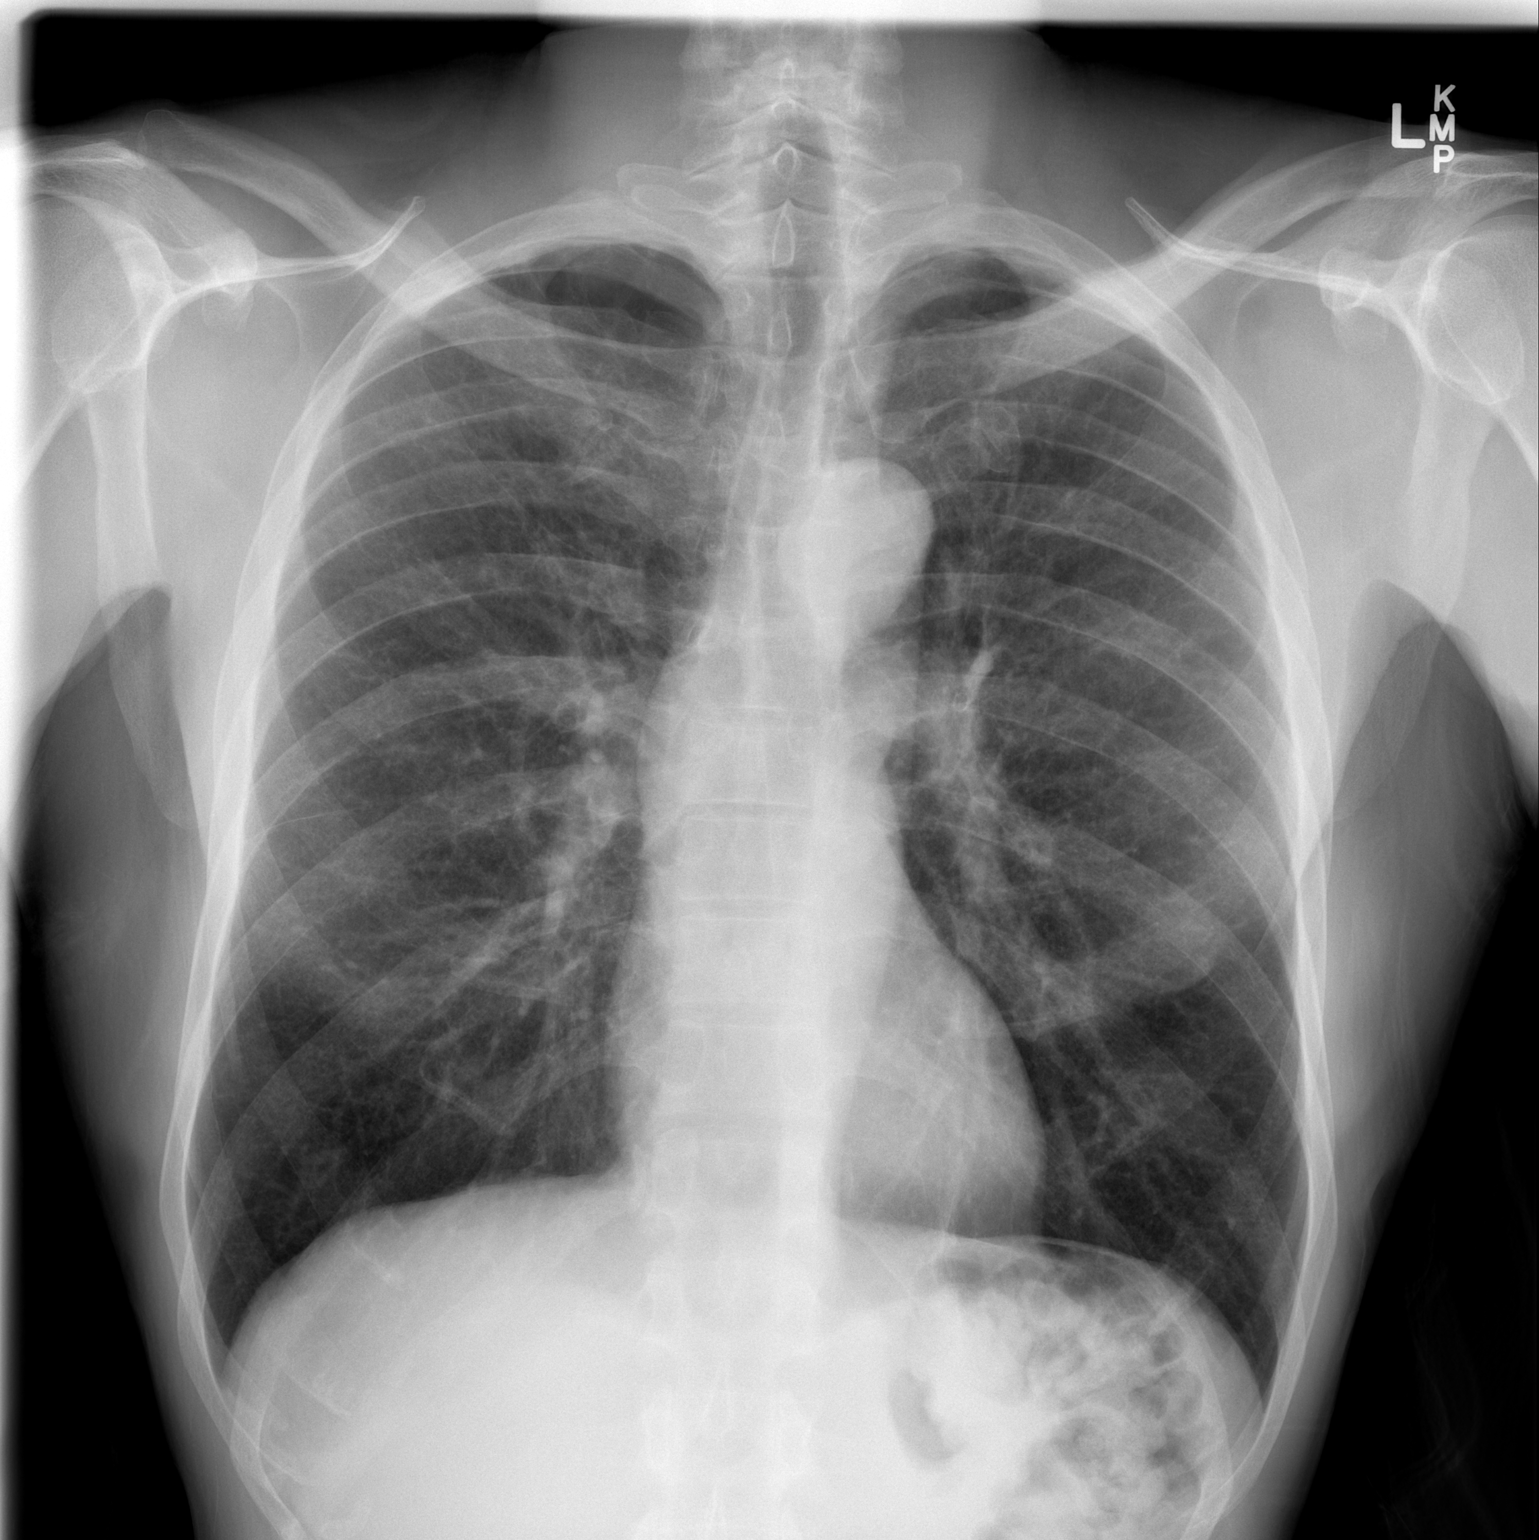

[w chest lat]
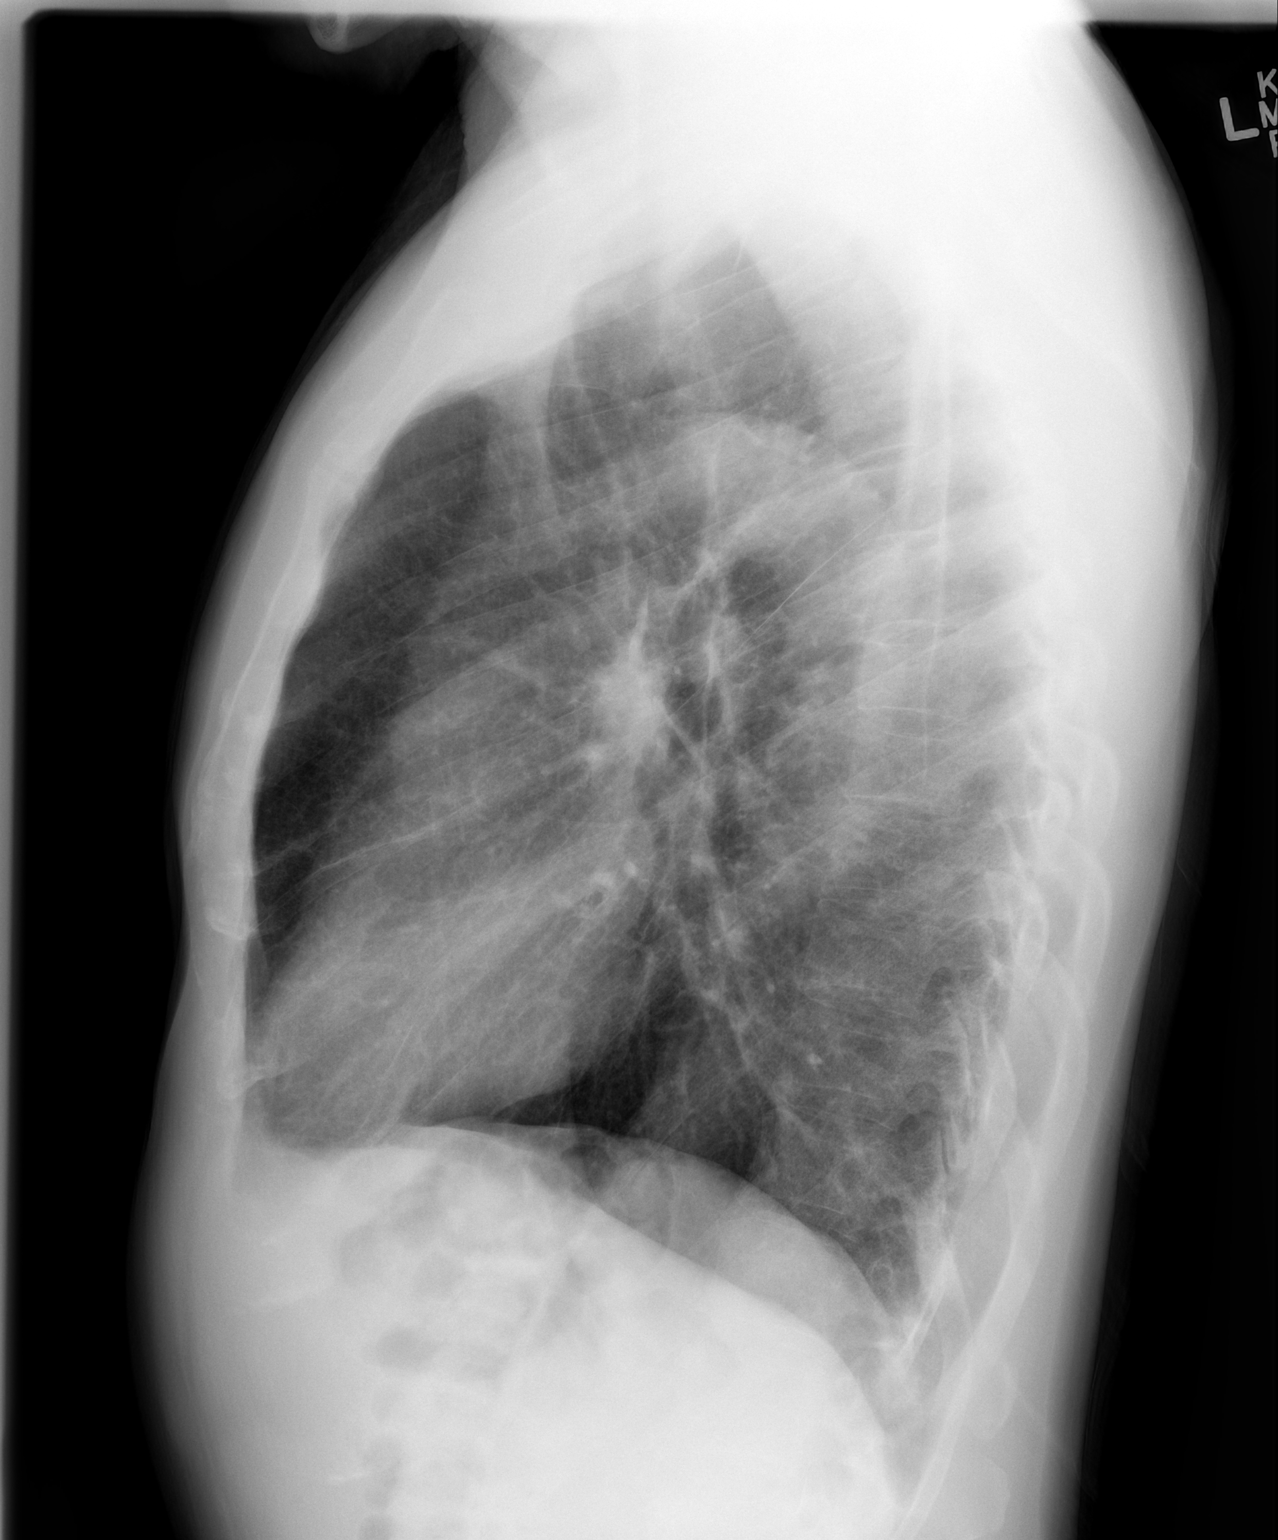

[2 of 2 positions shown; findings below may reference images not displayed]

FINDINGS: The heart, hila, and mediastinum are normal. No pneumothorax. No
pulmonary nodules or masses. No focal infiltrates.
IMPRESSION: No active cardiopulmonary disease.

## 2017-08-29 ENCOUNTER — Other Ambulatory Visit: Payer: Self-pay | Admitting: Medical

## 2017-09-06 ENCOUNTER — Encounter: Payer: Self-pay | Admitting: Medical

## 2017-09-06 ENCOUNTER — Ambulatory Visit: Payer: BLUE CROSS/BLUE SHIELD | Admitting: Medical

## 2017-09-06 VITALS — BP 120/78 | HR 62 | Temp 98.0°F | Resp 16 | Ht 71.0 in | Wt 162.6 lb

## 2017-09-06 DIAGNOSIS — Z87891 Personal history of nicotine dependence: Secondary | ICD-10-CM

## 2017-09-06 DIAGNOSIS — E785 Hyperlipidemia, unspecified: Secondary | ICD-10-CM

## 2017-09-06 DIAGNOSIS — I10 Essential (primary) hypertension: Secondary | ICD-10-CM

## 2017-09-06 DIAGNOSIS — R3129 Other microscopic hematuria: Secondary | ICD-10-CM

## 2017-09-06 LAB — POCT URINALYSIS DIP (PROADVANTAGE DEVICE)
BILIRUBIN UA: NEGATIVE
BILIRUBIN UA: NEGATIVE mg/dL
Glucose, UA: NEGATIVE mg/dL
LEUKOCYTES UA: NEGATIVE
Nitrite, UA: NEGATIVE
Protein Ur, POC: NEGATIVE mg/dL
Specific Gravity, Urine: 1.03
Urobilinogen, Ur: 3.5
pH, UA: 6 (ref 5.0–8.0)
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
## 2017-09-06 NOTE — Patient Instructions (Signed)
We will call with cholesterol labs  Since you want to use trial off medication, go 3 months off cholesterol medication and plan to recheck cholesterol in 3 months fasting  For blood pressure, if you stop this, monitor you blood pressure 3-4 days per week at the pharmacy.  Write down the numbers and get me numbers in 1 month  Normal is 120/70 Borderline is 130/80 The threshold for high blood pressure is 140/90   Hypertension Hypertension is another name for high blood pressure. High blood pressure forces your heart to work harder to pump blood. This can cause problems over time. There are two numbers in a blood pressure reading. There is a top number (systolic) over a bottom number (diastolic). It is best to have a blood pressure below 120/80. Healthy choices can help lower your blood pressure. You may need medicine to help lower your blood pressure if:  Your blood pressure cannot be lowered with healthy choices.  Your blood pressure is higher than 130/80.  Follow these instructions at home: Eating and drinking  If directed, follow the DASH eating plan. This diet includes: ? Filling half of your plate at each meal with fruits and vegetables. ? Filling one quarter of your plate at each meal with whole grains. Whole grains include whole wheat pasta, brown rice, and whole grain bread. ? Eating or drinking low-fat dairy products, such as skim milk or low-fat yogurt. ? Filling one quarter of your plate at each meal with low-fat (lean) proteins. Low-fat proteins include fish, skinless chicken, eggs, beans, and tofu. ? Avoiding fatty meat, cured and processed meat, or chicken with skin. ? Avoiding premade or processed food.  Eat less than 1,500 mg of salt (sodium) a day.  Limit alcohol use to no more than 1 drink a day for nonpregnant women and 2 drinks a day for men. One drink equals 12 oz of beer, 5 oz of wine, or 1 oz of hard liquor. Lifestyle  Work with your doctor to stay at a  healthy weight or to lose weight. Ask your doctor what the best weight is for you.  Get at least 30 minutes of exercise that causes your heart to beat faster (aerobic exercise) most days of the week. This may include walking, swimming, or biking.  Get at least 30 minutes of exercise that strengthens your muscles (resistance exercise) at least 3 days a week. This may include lifting weights or pilates.  Do not use any products that contain nicotine or tobacco. This includes cigarettes and e-cigarettes. If you need help quitting, ask your doctor.  Check your blood pressure at home as told by your doctor.  Keep all follow-up visits as told by your doctor. This is important. Medicines  Take over-the-counter and prescription medicines only as told by your doctor. Follow directions carefully.  Do not skip doses of blood pressure medicine. The medicine does not work as well if you skip doses. Skipping doses also puts you at risk for problems.  Ask your doctor about side effects or reactions to medicines that you should watch for. Contact a doctor if:  You think you are having a reaction to the medicine you are taking.  You have headaches that keep coming back (recurring).  You feel dizzy.  You have swelling in your ankles.  You have trouble with your vision. Get help right away if:  You get a very bad headache.  You start to feel confused.  You feel weak or numb.  You  feel faint.  You get very bad pain in your: ? Chest. ? Belly (abdomen).  You throw up (vomit) more than once.  You have trouble breathing. Summary  Hypertension is another name for high blood pressure.  Making healthy choices can help lower blood pressure. If your blood pressure cannot be controlled with healthy choices, you may need to take medicine. This information is not intended to replace advice given to you by your health care provider. Make sure you discuss any questions you have with your health care  provider. Document Released: 08/17/2007 Document Revised: 01/27/2016 Document Reviewed: 01/27/2016 Elsevier Interactive Patient Education  [employer].

## 2017-09-06 NOTE — Addendum Note (Signed)
Addended by: Derinda LateLAMPART, Wyatte Dames G on: 09/06/2017 02:21 PM   Modules accepted: Orders

## 2017-09-06 NOTE — Addendum Note (Signed)
Addended by: Jac CanavanYSINGER, Gilma Bessette S on: 09/06/2017 02:37 PM   Modules accepted: Orders

## 2017-09-07 LAB — URINALYSIS, MICROSCOPIC ONLY
BACTERIA UA: NONE SEEN
Casts: NONE SEEN /lpf

## 2017-09-07 LAB — LIPID PANEL
CHOLESTEROL TOTAL: 168 mg/dL (ref 100–199)
Chol/HDL Ratio: 2.8 ratio (ref 0.0–5.0)
HDL: 59 mg/dL (ref >39)
LDL CALC: 96 mg/dL (ref 0–99)
Triglycerides: 63 mg/dL (ref 0–149)
VLDL CHOLESTEROL CAL: 13 mg/dL (ref 5–40)

## 2017-09-21 ENCOUNTER — Other Ambulatory Visit: Payer: Self-pay | Admitting: Medical

## 2017-09-21 ENCOUNTER — Telehealth: Payer: Self-pay

## 2017-09-21 MED ORDER — ROSUVASTATIN CALCIUM 20 MG PO TABS: 20.0000 mg | ORAL_TABLET | Freq: Every day | ORAL | 3 refills | Status: DC

## 2017-09-21 MED ORDER — LOSARTAN POTASSIUM 25 MG PO TABS: 25.0000 mg | ORAL_TABLET | Freq: Every day | ORAL | 3 refills | Status: DC

## 2017-09-21 MED ORDER — ASPIRIN EC 81 MG PO TBEC: 81.0000 mg | DELAYED_RELEASE_TABLET | Freq: Every day | ORAL | 3 refills | Status: DC

## 2017-09-21 NOTE — Telephone Encounter (Signed)
Patient left voicemail stating that he has went back on his blood pressure medication.  He tried going without it and states that bp went up and now it is back down.

## 2017-09-21 NOTE — Telephone Encounter (Signed)
I have him know that I understand, and appreciate him calling.    So restart the medicine and I made sure he had refills on the blood pressure pill, aspirin, and cholesterol medicine in case he goes back on the cholesterol medication too.   He was going to give a trial off of this and recheck labs in 3 months.  That is okay as well if that is what he chooses to do

## 2018-09-19 ENCOUNTER — Other Ambulatory Visit: Payer: Self-pay | Admitting: Medical

## 2018-09-24 ENCOUNTER — Encounter: Payer: Self-pay | Admitting: Medical

## 2018-11-01 ENCOUNTER — Other Ambulatory Visit: Payer: Self-pay | Admitting: Medical

## 2018-11-01 ENCOUNTER — Telehealth: Payer: Self-pay

## 2018-11-01 NOTE — Telephone Encounter (Signed)
Called pt to advise appt is needed. [place]

## 2018-11-02 NOTE — Telephone Encounter (Signed)
Kim called pt and left messsage for pt to call back to schedule

## 2018-12-07 ENCOUNTER — Telehealth: Payer: Self-pay | Admitting: Medical

## 2018-12-07 ENCOUNTER — Other Ambulatory Visit: Payer: Self-pay | Admitting: Medical

## 2018-12-07 MED ORDER — LOSARTAN POTASSIUM 25 MG PO TABS: ORAL_TABLET | ORAL | 0 refills | Status: DC

## 2018-12-07 NOTE — Telephone Encounter (Signed)
Pt called and needs refill on Losartan sent to the [facility] on [address]. Pt has a CPE scheduled for 10/13

## 2018-12-07 NOTE — Telephone Encounter (Signed)
Med sent.

## 2018-12-12 ENCOUNTER — Other Ambulatory Visit: Payer: Self-pay | Admitting: Medical

## 2018-12-12 NOTE — Telephone Encounter (Signed)
Pt has appointment 12/25/18

## 2018-12-25 ENCOUNTER — Other Ambulatory Visit: Payer: Self-pay

## 2018-12-25 ENCOUNTER — Encounter: Payer: BLUE CROSS/BLUE SHIELD | Admitting: Medical

## 2019-01-11 ENCOUNTER — Other Ambulatory Visit: Payer: Self-pay | Admitting: Medical

## 2019-01-11 NOTE — Telephone Encounter (Signed)
Is this appropriate? Patient canceled appointment after refills are given

## 2019-03-12 ENCOUNTER — Other Ambulatory Visit: Payer: Self-pay | Admitting: Medical

## 2019-04-08 ENCOUNTER — Other Ambulatory Visit: Payer: Self-pay | Admitting: Medical

## 2019-04-11 ENCOUNTER — Other Ambulatory Visit: Payer: Self-pay | Admitting: Medical

## 2019-04-11 ENCOUNTER — Telehealth: Payer: Self-pay | Admitting: Medical

## 2019-04-11 ENCOUNTER — Other Ambulatory Visit: Payer: Self-pay

## 2019-04-11 MED ORDER — LOSARTAN POTASSIUM 25 MG PO TABS: ORAL_TABLET | ORAL | 0 refills | Status: DC

## 2019-04-11 NOTE — Telephone Encounter (Signed)
Pt called and is requesting a refill on his losartan pt made a medcheck appt on 04/22/19 if he could get a 30 day supply until his appt pt uses [id number] - [place], [place] - [address]

## 2019-04-11 NOTE — Telephone Encounter (Signed)
Medication sent.

## 2019-04-22 ENCOUNTER — Encounter: Payer: Self-pay | Admitting: Medical

## 2019-04-22 ENCOUNTER — Other Ambulatory Visit: Payer: Self-pay | Admitting: Medical

## 2019-04-22 ENCOUNTER — Ambulatory Visit (INDEPENDENT_AMBULATORY_CARE_PROVIDER_SITE_OTHER): Payer: 59 | Admitting: Medical

## 2019-04-22 ENCOUNTER — Other Ambulatory Visit: Payer: Self-pay

## 2019-04-22 VITALS — BP 130/80 | HR 68 | Temp 98.0°F | Ht 71.0 in | Wt 166.6 lb

## 2019-04-22 DIAGNOSIS — M545 Low back pain: Secondary | ICD-10-CM

## 2019-04-22 DIAGNOSIS — Z125 Encounter for screening for malignant neoplasm of prostate: Secondary | ICD-10-CM | POA: Diagnosis not present

## 2019-04-22 DIAGNOSIS — R0602 Shortness of breath: Secondary | ICD-10-CM

## 2019-04-22 DIAGNOSIS — Z87891 Personal history of nicotine dependence: Secondary | ICD-10-CM

## 2019-04-22 DIAGNOSIS — I10 Essential (primary) hypertension: Secondary | ICD-10-CM

## 2019-04-22 DIAGNOSIS — Z0000 Encounter for general adult medical examination without abnormal findings: Secondary | ICD-10-CM | POA: Diagnosis not present

## 2019-04-22 DIAGNOSIS — Z8042 Family history of malignant neoplasm of prostate: Secondary | ICD-10-CM

## 2019-04-22 DIAGNOSIS — T3110 Burns involving 10-19% of body surface with 0% to 9% third degree burns: Secondary | ICD-10-CM

## 2019-04-22 DIAGNOSIS — Z7185 Encounter for immunization safety counseling: Secondary | ICD-10-CM | POA: Insufficient documentation

## 2019-04-22 DIAGNOSIS — G8929 Other chronic pain: Secondary | ICD-10-CM

## 2019-04-22 DIAGNOSIS — Z7189 Other specified counseling: Secondary | ICD-10-CM

## 2019-04-22 DIAGNOSIS — E785 Hyperlipidemia, unspecified: Secondary | ICD-10-CM

## 2019-04-22 LAB — POCT URINALYSIS DIP (PROADVANTAGE DEVICE)
Bilirubin, UA: NEGATIVE
Blood, UA: NEGATIVE
Glucose, UA: NEGATIVE mg/dL
Ketones, POC UA: NEGATIVE mg/dL
Leukocytes, UA: NEGATIVE
Nitrite, UA: NEGATIVE
Protein Ur, POC: NEGATIVE mg/dL
Specific Gravity, Urine: 1.02
Urobilinogen, Ur: NEGATIVE
pH, UA: 6 (ref 5.0–8.0)

## 2019-04-22 MED ORDER — BREO ELLIPTA 100-25 MCG/INH IN AEPB: 1.0000 | INHALATION_SPRAY | Freq: Every day | RESPIRATORY_TRACT | 0 refills | Status: DC

## 2019-04-22 MED ORDER — ALBUTEROL SULFATE HFA 108 (90 BASE) MCG/ACT IN AERS: 2.0000 | INHALATION_SPRAY | Freq: Four times a day (QID) | RESPIRATORY_TRACT | 0 refills | Status: DC | PRN

## 2019-04-22 NOTE — Addendum Note (Signed)
Addended by: Victorio Palm on: 04/22/2019 01:14 PM   Modules accepted: Orders
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
## 2019-04-22 NOTE — Patient Instructions (Signed)
Preventative Care for Adults - Male    Thank you for coming in for your well visit today, and thank you for trusting [place] with your care!   Maintain regular health and wellness exams:  A routine yearly physical is a good way to check in with your primary care provider about your health and preventive screening. It is also an opportunity to share updates about your health and any concerns you have, and receive a thorough all-over exam.   Most health insurance companies pay for at least some preventative services.  Check with your health plan for specific coverages.  What preventative services do men need?  Adult men should have their weight and blood pressure checked regularly.   Men age 74 and older should have their cholesterol levels checked regularly.  Beginning at age 48 and continuing to age 4, men should be screened for colorectal cancer.  Certain people may need continued testing until age 62.  Updating vaccinations is part of preventative care.  Vaccinations help protect against diseases such as the flu.  Osteoporosis is a disease in which the bones lose minerals and strength as we age. Men ages 72 and over should discuss this with their caregivers  Lab tests are generally done as part of preventative care to screen for anemia and blood disorders, to screen for problems with the kidneys and liver, to screen for bladder problems, to check blood sugar, and to check your cholesterol level.  Preventative services generally include counseling about diet, exercise, avoiding tobacco, drugs, excessive alcohol consumption, and sexually transmitted infections.   Xrays and CT scans are not normally done as a preventative test, and most insurances do not pay for imaging for screening other than as discussed under cancer screens below.   On the other hand, if you have certain medical concerns, imaging may be necessary as a diagnostic test.    Your Medical Team Your medical team starts with  [place], your PCP or primary care provider.  Please use our services for your routine care such as physicals, screenings, immunizations, sick visits, and your first stop for general medical concerns.  You can call our number for after hours information for urgent questions that may need attention but cannot wait til the next business day.    Urgent care-urgent cares exist to provide care when your primary care office would typically be closed such as evenings or weekends.   Urgent care is for evaluation of urgent medical problems that do not necessarily require emergency department care, but cannot wait til the next business day when we are open.  Emergency department care-please reserve emergency department care for serious, urgent, possibly life-threatening medical problems.  This includes issues like possible stroke, heart attack, significant injury, mental health crisis, or other urgent need that requires immediate medical attention.     See your dentist office twice yearly for hygiene and cleaning visits.   Brush your teeth and floss your teeth daily.  See your eye doctor yearly for routine eye exam and screenings for glaucoma and retinal disease.   Specific Concerns: Shortness of breath - I may have you get a chest xray.   We will refer to cardiology for baseline evaluation.   Vaccines:  Stay up to date with your tetanus shots and other required immunizations. You should have a booster for tetanus every 10 years. Be sure to get your flu shot every year, since 5%-20% of the [place] population comes down with the flu. The flu vaccine changes  each year, so being vaccinated once is not enough. Get your shot in the fall, before the flu season peaks.   Other vaccines to consider:  Pneumococcal vaccine to protect against certain types of pneumonia.  This is normally recommended for adults age 20 or older.  However, adults younger than 56 years old with certain underlying conditions such as diabetes, heart  or lung disease should also receive the vaccine.  Shingles vaccine to protect against Varicella Zoster if you are older than age 67, or younger than 56 years old with certain underlying illness.  If you have not had the Shingrix vaccine, please call your insurer to inquire about coverage for the Shingrix vaccine given in 2 doses.   Some insurers cover this vaccine after age 24, some cover this after age 73.  If your insurer covers this, then call to schedule appointment to have this vaccine here  Hepatitis A vaccine to protect against a form of infection of the liver by a virus acquired from food.  Hepatitis B vaccine to protect against a form of infection of the liver by a virus acquired from blood or body fluids, particularly if you work in health care.  If you plan to travel internationally, check with your local health department for specific vaccination recommendations.  Human Papilloma Virus or HPV causes cancer of the cervix, and other infections that can be transmitted from person to person. There is a vaccine for HPV, and males should get immunized between the ages of 80 and 56. It requires a series of 3 shots.   Covid/Coronavirus - as the vaccines are becoming available, please consider vaccination if you are a health care worker, first responder, or have significant health problems such as asthma, COPD, heart disease, hypertension, diabetes, obesity, multiple medical problems, over age 98yo, or immunocompromised.    You are up to date on Tetanus  I recommend the following vaccines: Covid vaccine Shingrix vaccine Yearly flu shot    What should I know about Cancer screening? Many types of cancers can be detected early and may often be prevented. Lung Cancer  You should be screened every year for lung cancer if: ? You are a current smoker who has smoked for at least 30 years. ? You are a former smoker who has quit within the past 15 years.  Talk to your health care provider  about your screening options, when you should start screening, and how often you should be screened.  Colorectal Cancer  Routine colorectal cancer screening usually begins at 56 years of age and should be repeated every 5-10 years until you are 56 years old. You may need to be screened more often if early forms of precancerous polyps or small growths are found. Your health care provider may recommend screening at an earlier age if you have risk factors for colon cancer.  Your health care provider may recommend using home test kits to check for hidden blood in the stool.  A small camera at the end of a tube can be used to examine your colon (sigmoidoscopy or colonoscopy). This checks for the earliest forms of colorectal cancer.  Prostate and Testicular Cancer  Depending on your age and overall health, your health care provider may do certain tests to screen for prostate and testicular cancer.  Talk to your health care provider about any symptoms or concerns you have about testicular or prostate cancer.  Skin Cancer  Check your skin from head to toe regularly.  Tell your health  care provider about any new moles or changes in moles, especially if: ? There is a change in a mole's size, shape, or color. ? You have a mole that is larger than a pencil eraser.  Always use sunscreen. Apply sunscreen liberally and repeat throughout the day.  Protect yourself by wearing long sleeves, pants, a wide-brimmed hat, and sunglasses when outside.   Are you up to date on cancer screenings:  COLON CANCER SCREENING:  Up to date  PROSTATE CANCER SCREENING:  PSA lab today  TESTICULAR CANCER SCREENING:  check your self monthly at home    GENERAL RECOMMENDATIONS FOR GOOD HEALTH:  Healthy diet:  Eat a variety of foods, including fruit, vegetables, animal or vegetable protein, such as meat, fish, chicken, and eggs, or beans, lentils, tofu, and grains, such as rice.  Drink plenty of water  daily.  Decrease saturated fat in the diet, avoid lots of red meat, processed foods, sweets, fast foods, and fried foods.  Exercise:  Aerobic exercise helps maintain good heart health. At least 30-40 minutes of moderate-intensity exercise is recommended. For example, a brisk walk that increases your heart rate and breathing. This should be done on most days of the week.   Find a type of exercise or a variety of exercises that you enjoy so that it becomes a part of your daily life.  Examples are running, walking, swimming, water aerobics, and biking.  For motivation and support, explore group exercise such as aerobic class, spin class, Zumba, Yoga,or  martial arts, etc.    Set exercise goals for yourself, such as a certain weight goal, walk or run in a race such as a 5k walk/run.  Speak to your primary care provider about exercise goals.  Your weight readings per our records: Wt Readings from Last 3 Encounters:  04/22/19 166 lb 9.6 oz (75.6 kg)  09/06/17 162 lb 9.6 oz (73.8 kg)  12/27/16 167 lb 12.8 oz (76.1 kg)    Body mass index is 23.24 kg/m.    Disease prevention:  If you smoke or chew tobacco, find out from your caregiver how to quit. It can literally save your life, no matter how long you have been a tobacco user. If you do not use tobacco, never begin.   Maintain a healthy diet and normal weight. Increased weight leads to problems with blood pressure and diabetes.   The Body Mass Index or BMI is a way of measuring how much of your body is fat. Having a BMI above 27 increases the risk of heart disease, diabetes, hypertension, stroke and other problems related to obesity. Your caregiver can help determine your BMI and based on it develop an exercise and dietary program to help you achieve or maintain this important measurement at a healthful level.  High blood pressure causes heart and blood vessel problems.  Persistent high blood pressure should be treated with medicine if weight  loss and exercise do not work.  Your blood pressure readings per our records:     BP Readings from Last 3 Encounters:  04/22/19 130/80  09/06/17 120/78  12/27/16 126/68     Fat and cholesterol leaves deposits in your arteries that can block them. This causes heart disease and vessel disease elsewhere in your body.  If your cholesterol is found to be high, or if you have heart disease or certain other medical conditions, then you may need to have your cholesterol monitored frequently and be treated with medication.   Ask if you  should have a cardiac stress test if your history suggests this. A stress test is a test done on a treadmill that looks for heart disease. This test can find disease prior to there being a problem.    Heart disease screening:  Referral for baseline evaluation   Osteoporosis is a disease in which the bones lose minerals and strength as we age. This can result in serious bone fractures. Risk of osteoporosis can be identified using a bone density scan. Men ages 58 and over should discuss this with their caregivers. Ask your caregiver whether you should be taking a calcium supplement and Vitamin D, to reduce the rate of osteoporosis.   Avoid drinking alcohol in excess (more than two drinks per day).  Avoid use of street drugs. Do not share needles with anyone. Ask for professional help if you need assistance or instructions on stopping the use of alcohol, cigarettes, and/or drugs.  Brush your teeth twice a day with fluoride toothpaste, and floss once a day. Good oral hygiene prevents tooth decay and gum disease. The problems can be painful, unattractive, and can cause other health problems. Visit your dentist for a routine oral and dental check up and preventive care every 6-12 months.   Safety:  Use seatbelts 100% of the time, whether driving or as a passenger.  Use safety devices such as hearing protection if you work in environments with loud noise or significant  background noise.  Use safety glasses when doing any work that could send debris in to the eyes.  Use a helmet if you ride a bike or motorcycle.  Use appropriate safety gear for contact sports.  Talk to your caregiver about gun safety.  Use sunscreen with a SPF (or skin protection factor) of 15 or greater.  Lighter skinned people are at a greater risk of skin cancer. Don't forget to also wear sunglasses in order to protect your eyes from too much damaging sunlight. Damaging sunlight can accelerate cataract formation.   Keep carbon monoxide and smoke detectors in your home functioning at all times. Change the batteries every 6 months or use a model that plugs into the wall.    Sexual activity: . Sex is a normal part of life and sexual activity can continue into older adulthood for many healthy people.   . If you are having erectile dysfunction issues, please follow up to discuss this further.   . If you are not in a monogamous relationship or have more than one partner, please practice safe sex.  Use condoms. Condoms are used for birth control and to help reduce the spread of sexually transmitted infections (or STIs).  Some of the STIs are gonorrhea (the clap), chlamydia, syphilis, trichomonas, herpes, HPV (human papilloma virus) and HIV (human immunodeficiency virus) which causes AIDS. The herpes, HIV and HPV are viral illnesses that have no cure. These can result in disability, cancer and death.   We are able to test for STIs here at our office.

## 2019-04-23 ENCOUNTER — Other Ambulatory Visit: Payer: Self-pay | Admitting: Medical

## 2019-04-23 LAB — PSA: Prostate Specific Ag, Serum: 0.9 ng/mL (ref 0.0–4.0)

## 2019-04-23 LAB — LIPID PANEL
Chol/HDL Ratio: 4.2 ratio (ref 0.0–5.0)
Cholesterol, Total: 245 mg/dL — ABNORMAL HIGH (ref 100–199)
HDL: 59 mg/dL (ref >39)
LDL Chol Calc (NIH): 173 mg/dL — ABNORMAL HIGH (ref 0–99)
Triglycerides: 76 mg/dL (ref 0–149)
VLDL Cholesterol Cal: 13 mg/dL (ref 5–40)

## 2019-04-23 LAB — CBC WITH DIFFERENTIAL/PLATELET
Basophils Absolute: 0.1 10*3/uL (ref 0.0–0.2)
Basos: 1 %
EOS (ABSOLUTE): 0.4 10*3/uL (ref 0.0–0.4)
Eos: 8 %
Hematocrit: 47.7 % (ref 37.5–51.0)
Hemoglobin: 15.4 g/dL (ref 13.0–17.7)
Immature Grans (Abs): 0 10*3/uL (ref 0.0–0.1)
Immature Granulocytes: 0 %
Lymphocytes Absolute: 2.2 10*3/uL (ref 0.7–3.1)
Lymphs: 42 %
MCH: 25.8 pg — ABNORMAL LOW (ref 26.6–33.0)
MCHC: 32.3 g/dL (ref 31.5–35.7)
MCV: 80 fL (ref 79–97)
Monocytes Absolute: 0.5 10*3/uL (ref 0.1–0.9)
Monocytes: 9 %
Neutrophils Absolute: 2.1 10*3/uL (ref 1.4–7.0)
Neutrophils: 40 %
Platelets: 270 10*3/uL (ref 150–450)
RBC: 5.96 x10E6/uL — ABNORMAL HIGH (ref 4.14–5.80)
RDW: 13 % (ref 11.6–15.4)
WBC: 5.2 10*3/uL (ref 3.4–10.8)

## 2019-04-23 LAB — COMPREHENSIVE METABOLIC PANEL
ALT: 21 IU/L (ref 0–44)
AST: 21 IU/L (ref 0–40)
Albumin/Globulin Ratio: 1.5 (ref 1.2–2.2)
Albumin: 4.3 g/dL (ref 3.8–4.9)
Alkaline Phosphatase: 98 IU/L (ref 39–117)
BUN/Creatinine Ratio: 13 (ref 9–20)
BUN: 15 mg/dL (ref 6–24)
Bilirubin Total: 0.4 mg/dL (ref 0.0–1.2)
CO2: 22 mmol/L (ref 20–29)
Calcium: 9.4 mg/dL (ref 8.7–10.2)
Chloride: 104 mmol/L (ref 96–106)
Creatinine, Ser: 1.17 mg/dL (ref 0.76–1.27)
GFR calc Af Amer: 81 mL/min/{1.73_m2} (ref >59)
GFR calc non Af Amer: 70 mL/min/{1.73_m2} (ref >59)
Globulin, Total: 2.9 g/dL (ref 1.5–4.5)
Glucose: 87 mg/dL (ref 65–99)
Potassium: 4.6 mmol/L (ref 3.5–5.2)
Sodium: 142 mmol/L (ref 134–144)
Total Protein: 7.2 g/dL (ref 6.0–8.5)

## 2019-04-23 LAB — TSH: TSH: 1.16 u[IU]/mL (ref 0.450–4.500)

## 2019-04-23 MED ORDER — ROSUVASTATIN CALCIUM 20 MG PO TABS: ORAL_TABLET | ORAL | 3 refills | Status: DC

## 2019-04-23 MED ORDER — LOSARTAN POTASSIUM 25 MG PO TABS: ORAL_TABLET | ORAL | 3 refills | Status: DC

## 2019-04-23 MED ORDER — ASPIRIN EC 81 MG PO TBEC: 81.0000 mg | DELAYED_RELEASE_TABLET | Freq: Every day | ORAL | 3 refills | Status: DC

## 2019-04-25 ENCOUNTER — Ambulatory Visit
Admission: RE | Admit: 2019-04-25 | Discharge: 2019-04-25 | Disposition: A | Discharge status: Home / Self Care | Payer: 59 | Source: Ambulatory Visit | Attending: Medical | Admitting: Medical

## 2019-04-25 ENCOUNTER — Other Ambulatory Visit: Payer: Self-pay

## 2019-04-25 DIAGNOSIS — R0602 Shortness of breath: Secondary | ICD-10-CM

## 2019-04-25 DIAGNOSIS — Z87891 Personal history of nicotine dependence: Secondary | ICD-10-CM
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
## 2019-04-30 ENCOUNTER — Ambulatory Visit (INDEPENDENT_AMBULATORY_CARE_PROVIDER_SITE_OTHER): Payer: 59 | Admitting: Cardiovascular Disease

## 2019-04-30 ENCOUNTER — Encounter: Payer: Self-pay | Admitting: Cardiovascular Disease

## 2019-04-30 ENCOUNTER — Other Ambulatory Visit: Payer: Self-pay

## 2019-04-30 VITALS — BP 140/74 | HR 54 | Temp 96.8°F | Ht 71.0 in | Wt 167.6 lb

## 2019-04-30 DIAGNOSIS — R0602 Shortness of breath: Secondary | ICD-10-CM

## 2019-04-30 DIAGNOSIS — Z720 Tobacco use: Secondary | ICD-10-CM

## 2019-04-30 DIAGNOSIS — I10 Essential (primary) hypertension: Secondary | ICD-10-CM | POA: Diagnosis not present

## 2019-04-30 DIAGNOSIS — R079 Chest pain, unspecified: Secondary | ICD-10-CM | POA: Diagnosis not present

## 2019-04-30 NOTE — Patient Instructions (Signed)
Medication Instructions:  The current medical regimen is effective;  continue present plan and medications.  *If you need a refill on your cardiac medications before your next appointment, please call your pharmacy*  Lab Work: BNP today If you have labs (blood work) drawn today and your tests are completely normal, you will receive your results only by: Marland Kitchen MyChart Message (if you have MyChart) OR . A paper copy in the mail If you have any lab test that is abnormal or we need to change your treatment, we will call you to review the results.  Testing/Procedures: Echocardiogram - Your physician has requested that you have an echocardiogram. Echocardiography is a painless test that uses sound waves to create images of your heart. It provides your doctor with information about the size and shape of your heart and how well your heart's chambers and valves are working. This procedure takes approximately one hour. There are no restrictions for this procedure. This will be performed at our [facility] location - [address], [address].  Your physician has requested that you have a lexiscan myoview. A cardiac stress test is a cardiological test that measures the heart's ability to respond to external stress in a controlled clinical environment. The stress response is induced by intravenous pharmacological stimulation.   Follow-Up: At [facility], you and your health needs are our priority.  As part of our continuing mission to provide you with exceptional heart care, we have created designated Provider Care Teams.  These Care Teams include your primary Cardiologist (physician) and Advanced Practice Providers (APPs -  Physician Assistants and Nurse Practitioners) who all work together to provide you with the care you need, when you need it.  Your next appointment:   As needed.  The format for your next appointment:   Virtual   Provider:   Lennie Odor, MD

## 2019-05-01 LAB — BRAIN NATRIURETIC PEPTIDE: BNP: 15.3 pg/mL (ref 0.0–100.0)

## 2019-05-08 ENCOUNTER — Telehealth (HOSPITAL_COMMUNITY): Payer: Self-pay | Admitting: *Deleted

## 2019-05-08 ENCOUNTER — Telehealth (HOSPITAL_COMMUNITY): Payer: Self-pay

## 2019-05-08 NOTE — Telephone Encounter (Signed)
Patient given detailed instructions per Myocardial Perfusion Study Information Sheet for the test on 05/13/19 at 7:30. Patient notified to arrive 15 minutes early and that it is imperative to arrive on time for appointment to keep from having the test rescheduled.  If you need to cancel or reschedule your appointment, please call the office within 24 hours of your appointment. . Patient verbalized understanding.Daneil Dolin

## 2019-05-08 NOTE — Telephone Encounter (Signed)
Encounter complete. 

## 2019-05-10 ENCOUNTER — Ambulatory Visit (HOSPITAL_COMMUNITY)
Admission: RE | Admit: 2019-05-10 | Payer: 59 | Source: Ambulatory Visit | Attending: Cardiovascular Disease | Admitting: Cardiovascular Disease

## 2019-05-13 ENCOUNTER — Encounter (HOSPITAL_COMMUNITY): Payer: 59

## 2019-05-14 ENCOUNTER — Other Ambulatory Visit (HOSPITAL_COMMUNITY): Payer: 59

## 2019-05-17 ENCOUNTER — Other Ambulatory Visit: Payer: Self-pay | Admitting: Medical

## 2019-05-22 ENCOUNTER — Telehealth (HOSPITAL_COMMUNITY): Payer: Self-pay | Admitting: *Deleted

## 2019-05-22 NOTE — Telephone Encounter (Signed)
Patient given detailed instructions per Myocardial Perfusion Study Information Sheet for the test on 05/27/19 at 10:15. Patient notified to arrive 15 minutes early and that it is imperative to arrive on time for appointment to keep from having the test rescheduled.  If you need to cancel or reschedule your appointment, please call the office within 24 hours of your appointment. . Patient verbalized understanding.Joel Allen

## 2019-05-27 ENCOUNTER — Ambulatory Visit (HOSPITAL_BASED_OUTPATIENT_CLINIC_OR_DEPARTMENT_OTHER): Payer: 59

## 2019-05-27 ENCOUNTER — Other Ambulatory Visit: Payer: Self-pay

## 2019-05-27 ENCOUNTER — Ambulatory Visit (HOSPITAL_COMMUNITY): Payer: 59 | Attending: Cardiology

## 2019-05-27 DIAGNOSIS — R0602 Shortness of breath: Secondary | ICD-10-CM | POA: Insufficient documentation

## 2019-05-27 LAB — MYOCARDIAL PERFUSION IMAGING
LV dias vol: 96 mL (ref 62–150)
LV sys vol: 47 mL
Peak HR: 102 {beats}/min
Rest HR: 61 {beats}/min
SDS: 0
SRS: 0
SSS: 0
TID: 1.06

## 2019-05-27 LAB — ECHOCARDIOGRAM COMPLETE
Height: 71 in
Weight: 2672 oz

## 2019-05-27 MED ORDER — TECHNETIUM TC 99M TETROFOSMIN IV KIT
10.3000 | PACK | Freq: Once | INTRAVENOUS | Status: AC | PRN
  Administered 2019-05-27: 10.3 via INTRAVENOUS
  Filled 2019-05-27: qty 11

## 2019-05-27 MED ORDER — TECHNETIUM TC 99M TETROFOSMIN IV KIT
31.9000 | PACK | Freq: Once | INTRAVENOUS | Status: AC | PRN
  Administered 2019-05-27: 31.9 via INTRAVENOUS
  Filled 2019-05-27: qty 32

## 2019-05-27 MED ORDER — REGADENOSON 0.4 MG/5ML IV SOLN
0.4000 mg | Freq: Once | INTRAVENOUS | Status: AC
  Administered 2019-05-27: 0.4 mg via INTRAVENOUS

## 2020-03-06 ENCOUNTER — Other Ambulatory Visit: Payer: Self-pay | Admitting: Medical

## 2020-05-05 ENCOUNTER — Other Ambulatory Visit: Payer: Self-pay | Admitting: Medical

## 2020-05-13 ENCOUNTER — Other Ambulatory Visit: Payer: Self-pay | Admitting: Medical

## 2020-06-13 ENCOUNTER — Other Ambulatory Visit: Payer: Self-pay | Admitting: Medical

## 2020-06-15 NOTE — Telephone Encounter (Signed)
Schedule fasting CPX and send in 30 day only

## 2020-06-15 NOTE — Telephone Encounter (Signed)
Is this ok to refill, reply to [facility] 

## 2020-07-16 ENCOUNTER — Other Ambulatory Visit: Payer: Self-pay | Admitting: Medical

## 2020-07-17 ENCOUNTER — Telehealth: Payer: Self-pay

## 2020-07-17 ENCOUNTER — Other Ambulatory Visit: Payer: Self-pay

## 2020-07-17 MED ORDER — ALBUTEROL SULFATE HFA 108 (90 BASE) MCG/ACT IN AERS: 2.0000 | INHALATION_SPRAY | Freq: Four times a day (QID) | RESPIRATORY_TRACT | 0 refills | Status: DC | PRN

## 2020-07-17 MED ORDER — LOSARTAN POTASSIUM 25 MG PO TABS: ORAL_TABLET | ORAL | 0 refills | Status: DC

## 2020-07-17 NOTE — Telephone Encounter (Signed)
Pt called and scheduled CPE, needs refill Losartan and albuterol to [facility]

## 2020-07-17 NOTE — Telephone Encounter (Signed)
Done

## 2020-08-04 ENCOUNTER — Other Ambulatory Visit: Payer: Self-pay | Admitting: Medical

## 2020-08-04 ENCOUNTER — Telehealth: Payer: Self-pay | Admitting: Medical

## 2020-08-04 MED ORDER — ROSUVASTATIN CALCIUM 20 MG PO TABS: 20.0000 mg | ORAL_TABLET | Freq: Every day | ORAL | 0 refills | Status: DC

## 2020-08-04 MED ORDER — LOSARTAN POTASSIUM 25 MG PO TABS: ORAL_TABLET | ORAL | 0 refills | Status: DC

## 2020-08-04 MED ORDER — ASPIRIN EC 81 MG PO TBEC: 81.0000 mg | DELAYED_RELEASE_TABLET | Freq: Every day | ORAL | 3 refills | Status: DC
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
## 2020-08-04 NOTE — Telephone Encounter (Signed)
I sent the medications.  So does that mean the bear scared him away and he left everything including his tent?  Where was he camping just curious?  I think it would be really cold to see a bear in the wild, but also know this could be scary

## 2020-08-04 NOTE — Telephone Encounter (Signed)
Pt called and said he was on a camping trip this weekend and said  he saw a bear and left most of his medications. He is going out of town on Thursday morning and needs a refill on the Aspirin and Losartan. He uses the [facility] on [address].

## 2020-10-05 ENCOUNTER — Other Ambulatory Visit: Payer: Self-pay | Admitting: Medical

## 2020-10-05 NOTE — Telephone Encounter (Signed)
Pt has an appt at end of next month

## 2020-11-02 ENCOUNTER — Other Ambulatory Visit: Payer: Self-pay | Admitting: Medical

## 2020-11-04 ENCOUNTER — Other Ambulatory Visit: Payer: Self-pay

## 2020-11-04 ENCOUNTER — Ambulatory Visit (INDEPENDENT_AMBULATORY_CARE_PROVIDER_SITE_OTHER): Payer: 59 | Admitting: Medical

## 2020-11-04 VITALS — BP 130/90 | HR 58 | Ht 71.5 in | Wt 156.0 lb

## 2020-11-04 DIAGNOSIS — Z8042 Family history of malignant neoplasm of prostate: Secondary | ICD-10-CM | POA: Diagnosis not present

## 2020-11-04 DIAGNOSIS — E785 Hyperlipidemia, unspecified: Secondary | ICD-10-CM

## 2020-11-04 DIAGNOSIS — I10 Essential (primary) hypertension: Secondary | ICD-10-CM | POA: Diagnosis not present

## 2020-11-04 DIAGNOSIS — Z87891 Personal history of nicotine dependence: Secondary | ICD-10-CM

## 2020-11-04 DIAGNOSIS — Z0000 Encounter for general adult medical examination without abnormal findings: Secondary | ICD-10-CM | POA: Diagnosis not present

## 2020-11-04 DIAGNOSIS — D751 Secondary polycythemia: Secondary | ICD-10-CM

## 2020-11-04 DIAGNOSIS — M545 Low back pain, unspecified: Secondary | ICD-10-CM

## 2020-11-04 DIAGNOSIS — G8929 Other chronic pain: Secondary | ICD-10-CM

## 2020-11-04 DIAGNOSIS — Z7185 Encounter for immunization safety counseling: Secondary | ICD-10-CM

## 2020-11-04 DIAGNOSIS — Z125 Encounter for screening for malignant neoplasm of prostate: Secondary | ICD-10-CM

## 2020-11-04 LAB — URINALYSIS
Bilirubin, UA: NEGATIVE
Glucose, UA: NEGATIVE
Ketones, UA: NEGATIVE
Leukocytes,UA: NEGATIVE
Nitrite, UA: NEGATIVE
Protein,UA: NEGATIVE
RBC, UA: NEGATIVE
Specific Gravity, UA: 1.013 (ref 1.005–1.030)
Urobilinogen, Ur: 0.2 mg/dL (ref 0.2–1.0)
pH, UA: 6.5 (ref 5.0–7.5)

## 2020-11-04 NOTE — Patient Instructions (Signed)
This visit was a preventative care visit, also known as wellness visit or routine physical.   Topics typically include healthy lifestyle, diet, exercise, preventative care, vaccinations, sick and well care, proper use of emergency dept and after hours care, as well as other concerns.     Recommendations: Continue to return yearly for your annual wellness and preventative care visits.  This gives [place] a chance to discuss healthy lifestyle, exercise, vaccinations, review your chart record, and perform screenings where appropriate.  I recommend you see your eye doctor yearly for routine vision care.  I recommend you see your dentist yearly for routine dental care including hygiene visits twice yearly.    Vaccination recommendations were reviewed Immunization History  Administered Date(s) Administered   Influenza,inj,Quad PF,6+ Mos 04/01/2015, 12/27/2016   Pneumococcal Polysaccharide-23 04/01/2015   Tdap 11/01/2012   Shingles vaccine:  I recommend you have a shingles vaccine to help prevent shingles or herpes zoster outbreak.   Please call your insurer to inquire about coverage for the Shingrix vaccine given in 2 doses.   Some insurers cover this vaccine after age 14, some cover this after age 76.  If your insurer covers this, then call to schedule appointment to have this vaccine here.  I recommend a yearly flu shot in the fall   Screening for cancer: Colon cancer screening: I reviewed your colonoscopy on file that is up to date from 2017 Please complete stool cards kit to return for hemoccult screening  We discussed PSA, prostate exam, and prostate cancer screening risks/benefits.     Skin cancer screening: Check your skin regularly for new changes, growing lesions, or other lesions of concern Come in for evaluation if you have skin lesions of concern.  Lung cancer screening: If you have a greater than 20 pack year history of tobacco use, then you may qualify for lung cancer  screening with a chest CT scan.   Please call your insurance company to inquire about coverage for this test.  We currently don't have screenings for other cancers besides breast, cervical, colon, and lung cancers.  If you have a strong family history of cancer or have other cancer screening concerns, please let me know.    Bone health: Get at least 150 minutes of aerobic exercise weekly Get weight bearing exercise at least once weekly Bone density test:  A bone density test is an imaging test that uses a type of X-ray to measure the amount of calcium and other minerals in your bones. The test may be used to diagnose or screen you for a condition that causes weak or thin bones (osteoporosis), predict your risk for a broken bone (fracture), or determine how well your osteoporosis treatment is working. The bone density test is recommended for females 25 and older, or females or males <62 if certain risk factors such as thyroid disease, long term use of steroids such as for asthma or rheumatological issues, vitamin D deficiency, estrogen deficiency, family history of osteoporosis, self or family history of fragility fracture in first degree relative.    Heart health: Get at least 150 minutes of aerobic exercise weekly Limit alcohol It is important to maintain a healthy blood pressure and healthy cholesterol numbers  Heart disease screening: Screening for heart disease includes screening for blood pressure, fasting lipids, glucose/diabetes screening, BMI height to weight ratio, reviewed of smoking status, physical activity, and diet.    Goals include blood pressure 120/80 or less, maintaining a healthy lipid/cholesterol profile, preventing diabetes or keeping  diabetes numbers under good control, not smoking or using tobacco products, exercising most days per week or at least 150 minutes per week of exercise, and eating healthy variety of fruits and vegetables, healthy oils, and avoiding unhealthy  food choices like fried food, fast food, high sugar and high cholesterol foods.    Other tests may possibly include EKG test, CT coronary calcium score, echocardiogram, exercise treadmill stress test.     Medical care options: I recommend you continue to seek care here first for routine care.  We try really hard to have available appointments Monday through Friday daytime hours for sick visits, acute visits, and physicals.  Urgent care should be used for after hours and weekends for significant issues that cannot wait till the next day.  The emergency department should be used for significant potentially life-threatening emergencies.  The emergency department is expensive, can often have long wait times for less significant concerns, so try to utilize primary care, urgent care, or telemedicine when possible to avoid unnecessary trips to the emergency department.  Virtual visits and telemedicine have been introduced since the pandemic started in 2020, and can be convenient ways to receive medical care.  We offer virtual appointments as well to assist you in a variety of options to seek medical care.   Separate significant issues discussed: Hypertension Check your blood pressures at home several days per week.  The goal is 120/70. If your blood pressure readings running greater than 130/80 regularly and you are not at goal Stage I high blood pressure starts at 140/90.  So if your top number is greater than 140 or bottom number greater than 90 then that is too high Risk of uncontrolled high blood pressure is kidney disease, heart failure, heart attack, stroke, damage to the eyes and other. Limit salt Continue regular exercise  Hyperlipidemia  Last year your numbers were too high greater than 130 for the LDL cholesterol You have the option of doing a test called a coronary CT to screen for cholesterol buildup in the coronary arteries.  This is a new test that was not available last year when you did  your other heart testing.  This test is being offered to $100 cash pay currently if you want to do this  Recommendations for improving lipids:  Foods TO AVOID or limit - fried foods, high sugar foods, white bread, enriched flour, fast food, red meat, large amounts of cheese, processed foods such as little debbie cakes, cookies, pies, donuts, for example  Foods TO INCLUDE in the diet - whole grains such as whole grain pasta, whole grain bread, barley, steel cut oatmeal (not instant oatmeal), avocado, fish, green leafy vegetables, nuts, increased fiber in diet, and using olive oil in small amounts for cooking or as salad dressing vinaigrette.
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
## 2020-11-05 ENCOUNTER — Other Ambulatory Visit: Payer: Self-pay | Admitting: Medical

## 2020-11-05 LAB — IRON,TIBC AND FERRITIN PANEL
Ferritin: 91 ng/mL (ref 30–400)
Iron Saturation: 28 % (ref 15–55)
Iron: 93 ug/dL (ref 38–169)
Total Iron Binding Capacity: 328 ug/dL (ref 250–450)
UIBC: 235 ug/dL (ref 111–343)

## 2020-11-05 LAB — COMPREHENSIVE METABOLIC PANEL
ALT: 13 IU/L (ref 0–44)
AST: 22 IU/L (ref 0–40)
Albumin/Globulin Ratio: 1.8 (ref 1.2–2.2)
Albumin: 4.4 g/dL (ref 3.8–4.9)
Alkaline Phosphatase: 94 IU/L (ref 44–121)
BUN/Creatinine Ratio: 13 (ref 9–20)
BUN: 14 mg/dL (ref 6–24)
Bilirubin Total: 0.5 mg/dL (ref 0.0–1.2)
CO2: 24 mmol/L (ref 20–29)
Calcium: 9.5 mg/dL (ref 8.7–10.2)
Chloride: 102 mmol/L (ref 96–106)
Creatinine, Ser: 1.05 mg/dL (ref 0.76–1.27)
Globulin, Total: 2.5 g/dL (ref 1.5–4.5)
Glucose: 93 mg/dL (ref 65–99)
Potassium: 4.4 mmol/L (ref 3.5–5.2)
Sodium: 138 mmol/L (ref 134–144)
Total Protein: 6.9 g/dL (ref 6.0–8.5)
eGFR: 83 mL/min/{1.73_m2} (ref >59)

## 2020-11-05 LAB — CBC
Hematocrit: 46.2 % (ref 37.5–51.0)
Hemoglobin: 15.1 g/dL (ref 13.0–17.7)
MCH: 26.2 pg — ABNORMAL LOW (ref 26.6–33.0)
MCHC: 32.7 g/dL (ref 31.5–35.7)
MCV: 80 fL (ref 79–97)
Platelets: 259 10*3/uL (ref 150–450)
RBC: 5.76 x10E6/uL (ref 4.14–5.80)
RDW: 13.1 % (ref 11.6–15.4)
WBC: 3.9 10*3/uL (ref 3.4–10.8)

## 2020-11-05 LAB — LIPID PANEL
Chol/HDL Ratio: 3.8 ratio (ref 0.0–5.0)
Cholesterol, Total: 245 mg/dL — ABNORMAL HIGH (ref 100–199)
HDL: 64 mg/dL (ref >39)
LDL Chol Calc (NIH): 166 mg/dL — ABNORMAL HIGH (ref 0–99)
Triglycerides: 89 mg/dL (ref 0–149)
VLDL Cholesterol Cal: 15 mg/dL (ref 5–40)

## 2020-11-05 LAB — PSA: Prostate Specific Ag, Serum: 0.9 ng/mL (ref 0.0–4.0)

## 2020-11-05 MED ORDER — ROSUVASTATIN CALCIUM 10 MG PO TABS: 10.0000 mg | ORAL_TABLET | Freq: Every day | ORAL | 3 refills | Status: DC

## 2020-11-05 MED ORDER — LOSARTAN POTASSIUM 25 MG PO TABS: 25.0000 mg | ORAL_TABLET | Freq: Every day | ORAL | 3 refills | Status: DC

## 2020-11-06 ENCOUNTER — Other Ambulatory Visit: Payer: Self-pay

## 2020-11-06 ENCOUNTER — Telehealth: Payer: Self-pay

## 2020-11-06 NOTE — Telephone Encounter (Signed)
Called pt back no answer called pt girlfriend and she advise he needed B/P med refilled. KH

## 2021-02-26 ENCOUNTER — Encounter: Payer: 59 | Admitting: Medical

## 2021-04-08 ENCOUNTER — Telehealth: Payer: Self-pay

## 2021-04-08 NOTE — Telephone Encounter (Signed)
Pt left message on my voice mail yesterday when I was not here states needing 5 to 7 Losartan pills to a [facility] while he was in [place].  I called back & left message, he has refills til August.  Not sure if he forgot his meds while out of town or what is going on?

## 2021-07-20 IMAGING — CR DG CHEST 2V
2 series · 2 of 2 positions shown · non-contrast
Comparison: March 19, 2015

CLINICAL DATA: Shortness of breath.  Hypertension.

EXAM:
CHEST - 2 VIEW

[w chest pa]
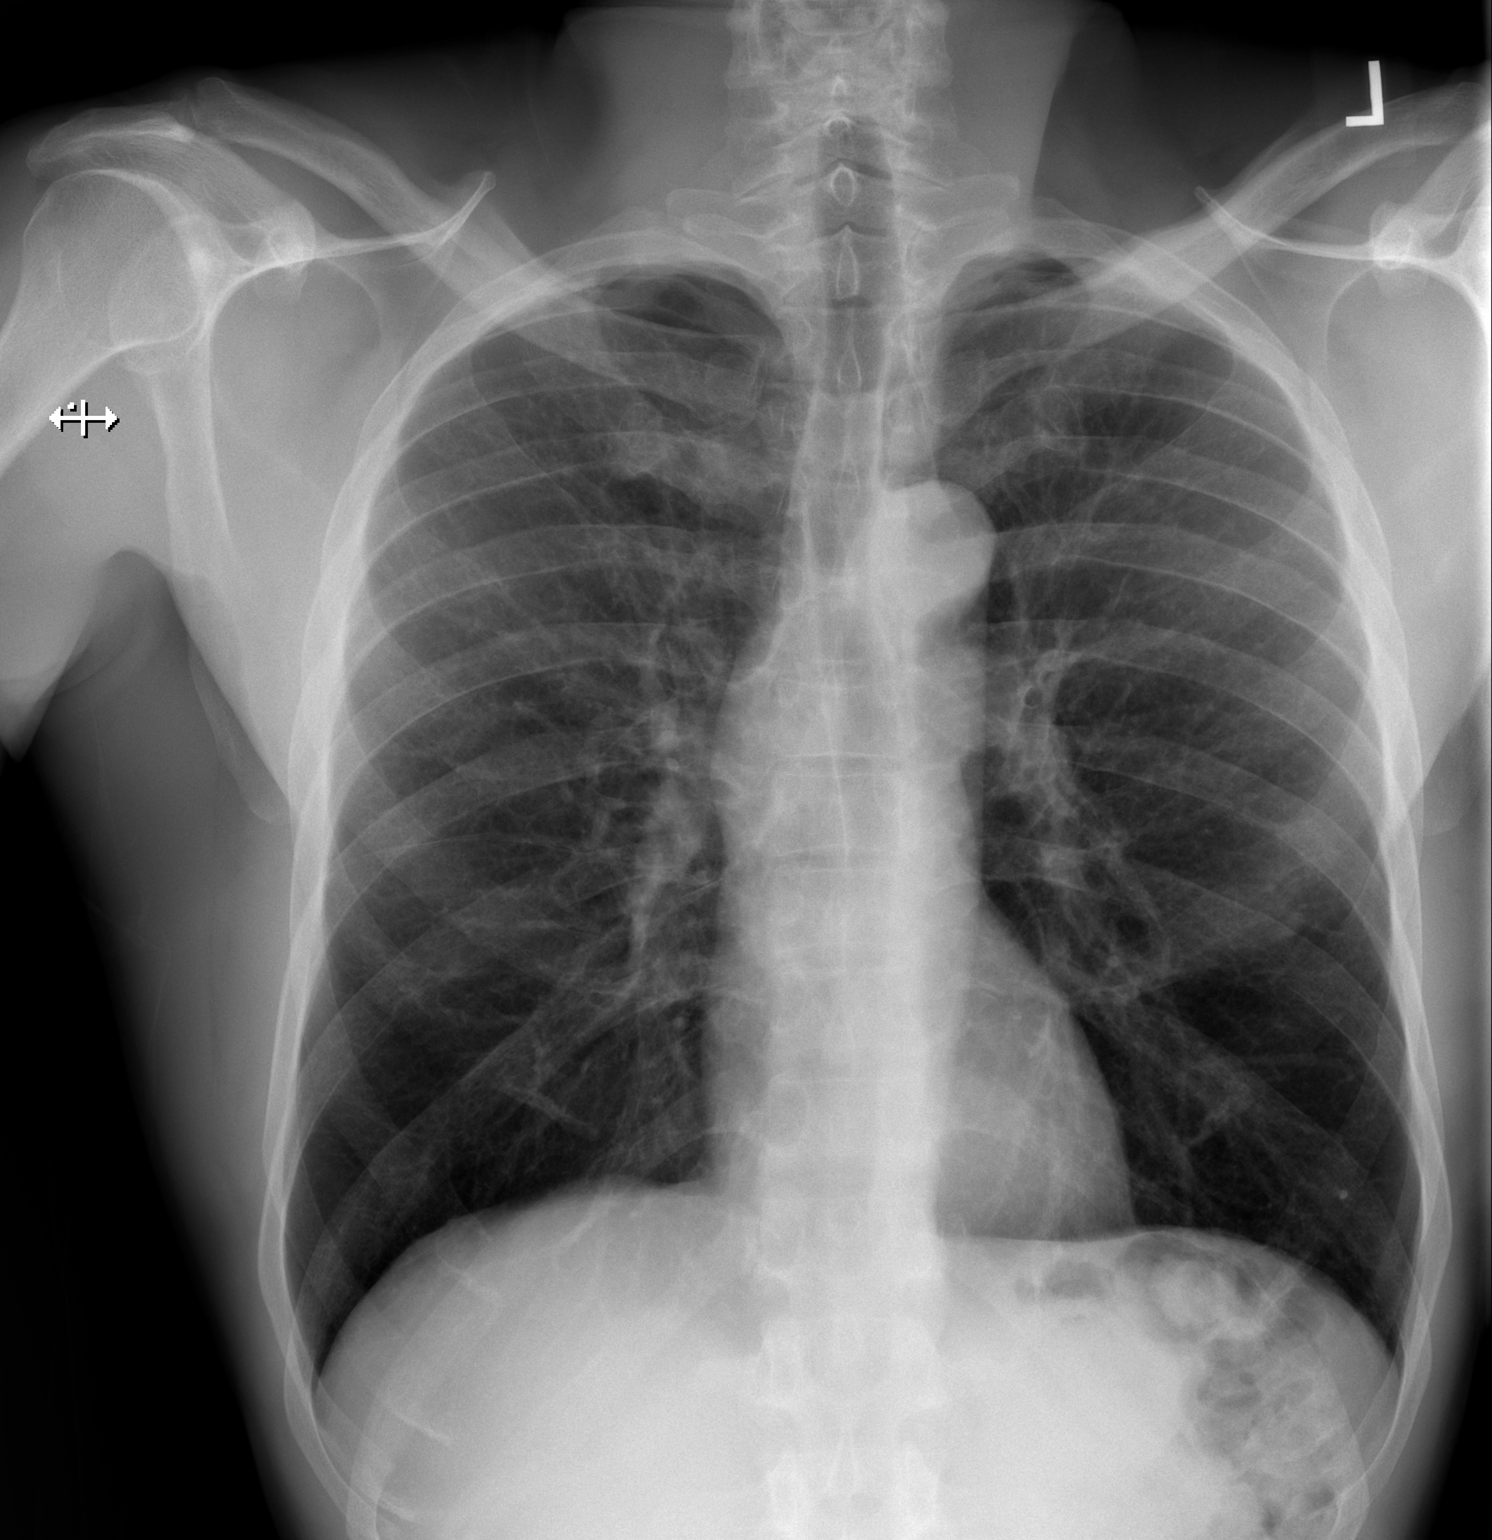

[w chest lat]
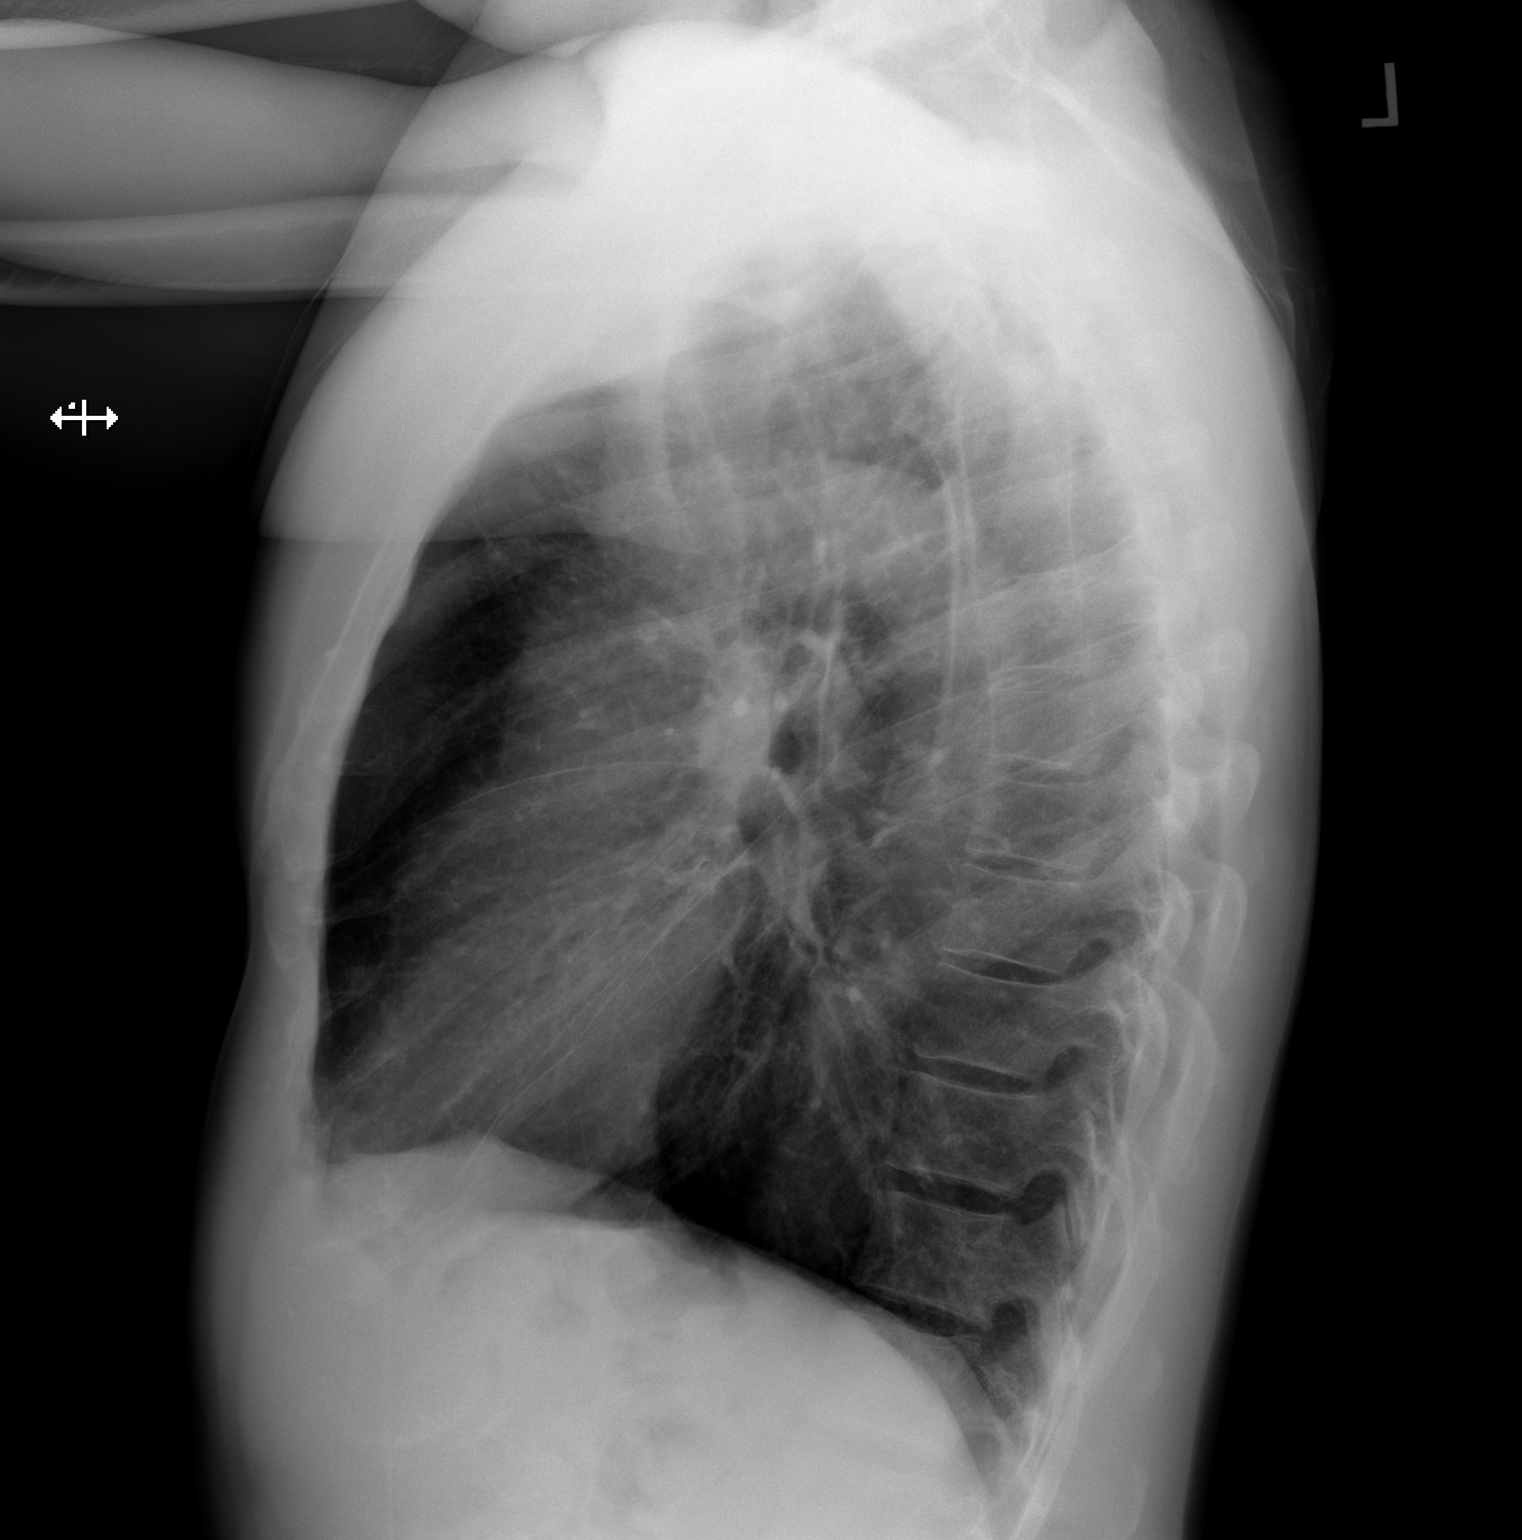

[2 of 2 positions shown; findings below may reference images not displayed]

FINDINGS: Lungs are clear. Heart size and pulmonary vascularity are normal. No
adenopathy. No bone lesions.
IMPRESSION: Lungs clear.  Cardiac silhouette normal.

## 2021-08-03 ENCOUNTER — Other Ambulatory Visit: Payer: Self-pay | Admitting: Medical

## 2021-08-11 ENCOUNTER — Other Ambulatory Visit: Payer: Self-pay | Admitting: Medical

## 2021-11-05 ENCOUNTER — Other Ambulatory Visit: Payer: Self-pay | Admitting: Medical

## 2021-11-05 MED ORDER — LOSARTAN POTASSIUM 25 MG PO TABS: 25.0000 mg | ORAL_TABLET | Freq: Every day | ORAL | 0 refills | Status: DC

## 2021-11-05 NOTE — Addendum Note (Signed)
Addended by: Herminio Commons A on: 11/05/2021 09:41 AM   Modules accepted: Orders

## 2021-11-05 NOTE — Telephone Encounter (Signed)
Pt is schedule for an appt so I have refill bp med for 30 days

## 2021-11-05 NOTE — Telephone Encounter (Signed)
Tried to call pt as he is due for an appointment but unable to leave a vm.

## 2021-11-09 ENCOUNTER — Ambulatory Visit (INDEPENDENT_AMBULATORY_CARE_PROVIDER_SITE_OTHER): Payer: Commercial Managed Care - HMO | Admitting: Medical

## 2021-11-09 VITALS — BP 133/74 | HR 80 | Ht 71.5 in | Wt 156.0 lb

## 2021-11-09 DIAGNOSIS — Z23 Encounter for immunization: Secondary | ICD-10-CM

## 2021-11-09 DIAGNOSIS — E785 Hyperlipidemia, unspecified: Secondary | ICD-10-CM | POA: Diagnosis not present

## 2021-11-09 DIAGNOSIS — I10 Essential (primary) hypertension: Secondary | ICD-10-CM | POA: Diagnosis not present

## 2021-11-09 LAB — POCT URINALYSIS DIP (PROADVANTAGE DEVICE)
Bilirubin, UA: NEGATIVE
Blood, UA: NEGATIVE
Glucose, UA: NEGATIVE mg/dL
Ketones, POC UA: NEGATIVE mg/dL
Leukocytes, UA: NEGATIVE
Nitrite, UA: NEGATIVE
Protein Ur, POC: NEGATIVE mg/dL
Specific Gravity, Urine: 1.015
Urobilinogen, Ur: NEGATIVE
pH, UA: 7.5 (ref 5.0–8.0)

## 2021-11-09 MED ORDER — LOSARTAN POTASSIUM 25 MG PO TABS: 25.0000 mg | ORAL_TABLET | Freq: Every day | ORAL | 3 refills | Status: DC
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
## 2021-11-10 ENCOUNTER — Other Ambulatory Visit: Payer: Self-pay | Admitting: Medical

## 2021-11-10 LAB — COMPREHENSIVE METABOLIC PANEL
ALT: 15 IU/L (ref 0–44)
AST: 22 IU/L (ref 0–40)
Albumin/Globulin Ratio: 1.4 (ref 1.2–2.2)
Albumin: 4.2 g/dL (ref 3.8–4.9)
Alkaline Phosphatase: 96 IU/L (ref 44–121)
BUN/Creatinine Ratio: 15 (ref 9–20)
BUN: 17 mg/dL (ref 6–24)
Bilirubin Total: 0.3 mg/dL (ref 0.0–1.2)
CO2: 25 mmol/L (ref 20–29)
Calcium: 9.4 mg/dL (ref 8.7–10.2)
Chloride: 107 mmol/L — ABNORMAL HIGH (ref 96–106)
Creatinine, Ser: 1.17 mg/dL (ref 0.76–1.27)
Globulin, Total: 2.9 g/dL (ref 1.5–4.5)
Glucose: 72 mg/dL (ref 70–99)
Potassium: 4.3 mmol/L (ref 3.5–5.2)
Sodium: 144 mmol/L (ref 134–144)
Total Protein: 7.1 g/dL (ref 6.0–8.5)
eGFR: 73 mL/min/{1.73_m2} (ref >59)

## 2021-11-10 LAB — LIPID PANEL
Chol/HDL Ratio: 3.6 ratio (ref 0.0–5.0)
Cholesterol, Total: 229 mg/dL — ABNORMAL HIGH (ref 100–199)
HDL: 63 mg/dL (ref >39)
LDL Chol Calc (NIH): 150 mg/dL — ABNORMAL HIGH (ref 0–99)
Triglycerides: 92 mg/dL (ref 0–149)
VLDL Cholesterol Cal: 16 mg/dL (ref 5–40)

## 2021-11-10 MED ORDER — ROSUVASTATIN CALCIUM 10 MG PO TABS: 10.0000 mg | ORAL_TABLET | Freq: Every day | ORAL | 3 refills | Status: DC

## 2021-12-21 ENCOUNTER — Encounter: Payer: Self-pay | Admitting: Internal Medicine

## 2022-03-10 ENCOUNTER — Telehealth: Payer: Self-pay | Admitting: Medical

## 2022-03-10 ENCOUNTER — Other Ambulatory Visit: Payer: Self-pay

## 2022-03-10 MED ORDER — LOSARTAN POTASSIUM 25 MG PO TABS: 25.0000 mg | ORAL_TABLET | Freq: Every day | ORAL | 3 refills | Status: DC

## 2022-03-10 MED ORDER — ROSUVASTATIN CALCIUM 10 MG PO TABS: 10.0000 mg | ORAL_TABLET | Freq: Every day | ORAL | 3 refills | Status: DC

## 2022-03-10 NOTE — Telephone Encounter (Signed)
Fax request from [facility]  Losartan 25 mg 90 day supply  Rosuvastatin 10 mg  90 day supply

## 2022-05-13 ENCOUNTER — Encounter: Payer: Commercial Managed Care - HMO | Admitting: Medical

## 2022-06-27 ENCOUNTER — Ambulatory Visit: Payer: Commercial Managed Care - HMO | Admitting: Medical

## 2022-06-27 ENCOUNTER — Encounter: Payer: Self-pay | Admitting: Medical

## 2022-06-27 VITALS — BP 110/80 | HR 64 | Ht 70.0 in | Wt 149.0 lb

## 2022-06-27 DIAGNOSIS — Z136 Encounter for screening for cardiovascular disorders: Secondary | ICD-10-CM

## 2022-06-27 DIAGNOSIS — Z0000 Encounter for general adult medical examination without abnormal findings: Secondary | ICD-10-CM | POA: Diagnosis not present

## 2022-06-27 DIAGNOSIS — E785 Hyperlipidemia, unspecified: Secondary | ICD-10-CM | POA: Diagnosis not present

## 2022-06-27 DIAGNOSIS — Z1211 Encounter for screening for malignant neoplasm of colon: Secondary | ICD-10-CM

## 2022-06-27 DIAGNOSIS — Z23 Encounter for immunization: Secondary | ICD-10-CM

## 2022-06-27 DIAGNOSIS — I10 Essential (primary) hypertension: Secondary | ICD-10-CM

## 2022-06-27 DIAGNOSIS — Z7185 Encounter for immunization safety counseling: Secondary | ICD-10-CM | POA: Diagnosis not present

## 2022-06-27 DIAGNOSIS — Z972 Presence of dental prosthetic device (complete) (partial): Secondary | ICD-10-CM

## 2022-06-27 DIAGNOSIS — Z125 Encounter for screening for malignant neoplasm of prostate: Secondary | ICD-10-CM

## 2022-06-27 LAB — POCT URINALYSIS DIP (PROADVANTAGE DEVICE)
Bilirubin, UA: NEGATIVE
Blood, UA: NEGATIVE
Glucose, UA: NEGATIVE mg/dL
Ketones, POC UA: NEGATIVE mg/dL
Leukocytes, UA: NEGATIVE
Nitrite, UA: NEGATIVE
Protein Ur, POC: NEGATIVE mg/dL
Specific Gravity, Urine: 1.02
Urobilinogen, Ur: NEGATIVE
pH, UA: 7 (ref 5.0–8.0)
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
## 2022-06-28 ENCOUNTER — Other Ambulatory Visit: Payer: Self-pay | Admitting: Medical

## 2022-06-28 LAB — COMPREHENSIVE METABOLIC PANEL
ALT: 14 IU/L (ref 0–44)
AST: 19 IU/L (ref 0–40)
Albumin/Globulin Ratio: 1.5 (ref 1.2–2.2)
Albumin: 4.3 g/dL (ref 3.8–4.9)
Alkaline Phosphatase: 108 IU/L (ref 44–121)
BUN/Creatinine Ratio: 13 (ref 9–20)
BUN: 14 mg/dL (ref 6–24)
Bilirubin Total: 0.4 mg/dL (ref 0.0–1.2)
CO2: 23 mmol/L (ref 20–29)
Calcium: 9.3 mg/dL (ref 8.7–10.2)
Chloride: 104 mmol/L (ref 96–106)
Creatinine, Ser: 1.04 mg/dL (ref 0.76–1.27)
Globulin, Total: 2.9 g/dL (ref 1.5–4.5)
Glucose: 85 mg/dL (ref 70–99)
Potassium: 4.4 mmol/L (ref 3.5–5.2)
Sodium: 140 mmol/L (ref 134–144)
Total Protein: 7.2 g/dL (ref 6.0–8.5)
eGFR: 83 mL/min/{1.73_m2} (ref >59)

## 2022-06-28 LAB — CBC
Hematocrit: 47.8 % (ref 37.5–51.0)
Hemoglobin: 15 g/dL (ref 13.0–17.7)
MCH: 25.4 pg — ABNORMAL LOW (ref 26.6–33.0)
MCHC: 31.4 g/dL — ABNORMAL LOW (ref 31.5–35.7)
MCV: 81 fL (ref 79–97)
Platelets: 262 10*3/uL (ref 150–450)
RBC: 5.9 x10E6/uL — ABNORMAL HIGH (ref 4.14–5.80)
RDW: 13.7 % (ref 11.6–15.4)
WBC: 4.4 10*3/uL (ref 3.4–10.8)

## 2022-06-28 LAB — LIPID PANEL
Chol/HDL Ratio: 2.8 ratio (ref 0.0–5.0)
Cholesterol, Total: 200 mg/dL — ABNORMAL HIGH (ref 100–199)
HDL: 71 mg/dL (ref >39)
LDL Chol Calc (NIH): 116 mg/dL — ABNORMAL HIGH (ref 0–99)
Triglycerides: 71 mg/dL (ref 0–149)
VLDL Cholesterol Cal: 13 mg/dL (ref 5–40)

## 2022-06-28 LAB — PSA: Prostate Specific Ag, Serum: 0.9 ng/mL (ref 0.0–4.0)

## 2022-06-28 MED ORDER — LOSARTAN POTASSIUM 25 MG PO TABS: 25.0000 mg | ORAL_TABLET | Freq: Every day | ORAL | 3 refills | Status: DC

## 2022-06-28 MED ORDER — ROSUVASTATIN CALCIUM 10 MG PO TABS: 10.0000 mg | ORAL_TABLET | Freq: Every day | ORAL | 0 refills | Status: DC

## 2022-06-28 NOTE — Progress Notes (Signed)
Results sent through MyChart

## 2022-07-11 ENCOUNTER — Telehealth: Payer: Self-pay | Admitting: Medical

## 2022-07-11 MED ORDER — ROSUVASTATIN CALCIUM 10 MG PO TABS: 10.0000 mg | ORAL_TABLET | Freq: Every day | ORAL | 3 refills | Status: DC

## 2022-07-11 MED ORDER — LOSARTAN POTASSIUM 25 MG PO TABS: 25.0000 mg | ORAL_TABLET | Freq: Every day | ORAL | 3 refills | Status: DC

## 2022-07-11 NOTE — Telephone Encounter (Signed)
Pharmacy sent refill request for crestor and Cozar [facility]

## 2022-07-11 NOTE — Telephone Encounter (Signed)
done

## 2022-08-04 ENCOUNTER — Ambulatory Visit (HOSPITAL_BASED_OUTPATIENT_CLINIC_OR_DEPARTMENT_OTHER)
Admission: RE | Admit: 2022-08-04 | Discharge: 2022-08-04 | Disposition: A | Discharge status: Home / Self Care | Payer: Self-pay | Source: Ambulatory Visit | Attending: Medical | Admitting: Medical

## 2022-08-04 DIAGNOSIS — Z0000 Encounter for general adult medical examination without abnormal findings: Secondary | ICD-10-CM | POA: Insufficient documentation

## 2022-08-04 DIAGNOSIS — Z136 Encounter for screening for cardiovascular disorders: Secondary | ICD-10-CM | POA: Insufficient documentation

## 2022-08-09 NOTE — Progress Notes (Signed)
Results sent through MyChart

## 2023-01-16 ENCOUNTER — Other Ambulatory Visit: Payer: Self-pay | Admitting: Medical

## 2023-08-29 ENCOUNTER — Other Ambulatory Visit: Payer: Self-pay | Admitting: Medical

## 2023-11-30 ENCOUNTER — Other Ambulatory Visit: Payer: Self-pay | Admitting: Medical

## 2023-11-30 NOTE — Telephone Encounter (Signed)
 Over due for an appt. Last visit 06/2022.  Please schedule. Once scheduled I can refill medication

## 2023-11-30 NOTE — Telephone Encounter (Signed)
 Called to schedule Physical, no answer.

## 2023-12-01 ENCOUNTER — Other Ambulatory Visit: Payer: Self-pay | Admitting: Medical

## 2023-12-01 MED ORDER — LOSARTAN POTASSIUM 25 MG PO TABS: 25.0000 mg | ORAL_TABLET | Freq: Every day | ORAL | 0 refills | Status: DC

## 2023-12-01 MED ORDER — ROSUVASTATIN CALCIUM 10 MG PO TABS: 10.0000 mg | ORAL_TABLET | Freq: Every day | ORAL | 0 refills | Status: DC

## 2023-12-01 NOTE — Telephone Encounter (Signed)
Patient physical scheduled.

## 2023-12-06 ENCOUNTER — Ambulatory Visit: Admitting: Medical

## 2023-12-06 VITALS — BP 132/94 | HR 72 | Ht 70.0 in | Wt 148.6 lb

## 2023-12-06 DIAGNOSIS — Z23 Encounter for immunization: Secondary | ICD-10-CM

## 2023-12-06 DIAGNOSIS — Z1389 Encounter for screening for other disorder: Secondary | ICD-10-CM

## 2023-12-06 DIAGNOSIS — R0600 Dyspnea, unspecified: Secondary | ICD-10-CM

## 2023-12-06 DIAGNOSIS — Z7185 Encounter for immunization safety counseling: Secondary | ICD-10-CM | POA: Diagnosis not present

## 2023-12-06 DIAGNOSIS — Z0000 Encounter for general adult medical examination without abnormal findings: Secondary | ICD-10-CM | POA: Diagnosis not present

## 2023-12-06 DIAGNOSIS — E785 Hyperlipidemia, unspecified: Secondary | ICD-10-CM | POA: Diagnosis not present

## 2023-12-06 DIAGNOSIS — Z87891 Personal history of nicotine dependence: Secondary | ICD-10-CM | POA: Diagnosis not present

## 2023-12-06 DIAGNOSIS — I10 Essential (primary) hypertension: Secondary | ICD-10-CM | POA: Diagnosis not present

## 2023-12-06 DIAGNOSIS — Z125 Encounter for screening for malignant neoplasm of prostate: Secondary | ICD-10-CM

## 2023-12-06 LAB — LIPID PANEL

## 2023-12-06 NOTE — Addendum Note (Signed)
 Addended by: LATTIE CARLO BROCKS on: 12/06/2023 03:48 PM   Modules accepted: Orders

## 2023-12-06 NOTE — Patient Instructions (Signed)
 Separate significant issues: Dyspnea - Discussed possible differential -EKG today, labs today -Chest x-ray order placed -Last smoked 7 years ago - Consider spirometry   Please go to [facility] for your chest xray.   Their hours are 8am - 4:30 pm Monday - Friday.  Take your insurance card with you.  [facility] [phone]   [address] [place], [place] [address]    Hypertension Hypertension controlled with medication. Monitors blood pressure at home. Prefers not to increase dosage due to risk of excessive elevation.  Continue losartan  25 mg daily  Hyperlipidemia Inconsistent management due to medication side effects. Resumed cholesterol medication. Previous CT showed aortic cholesterol buildup.  Continue Crestor  10 mg daily  Aortic atherosclerosis-continue statin  This visit was a preventative care visit, also known as wellness visit or routine physical.   Topics typically include healthy lifestyle, diet, exercise, preventative care, vaccinations, sick and well care, proper use of emergency dept and after hours care, as well as other concerns.      General Recommendations: Continue to return yearly for your annual wellness and preventative care visits.  This gives us  a chance to discuss healthy lifestyle, exercise, vaccinations, review your chart record, and perform screenings where appropriate.  I recommend you see your eye doctor yearly for routine vision care.  I recommend you see your dentist yearly for routine dental care including hygiene visits twice yearly.   Vaccination  Immunization History  Administered Date(s) Administered   Influenza,inj,Quad PF,6+ Mos 04/01/2015, 12/27/2016, 11/09/2021   Pneumococcal Polysaccharide-23 04/01/2015   Tdap 11/01/2012, 06/27/2022    Counseled on the influenza virus vaccine.  Vaccine information sheet given.  Influenza vaccine given after consent obtained.  Counseled on the pneumococcal vaccine.  Vaccine  information sheet given.  Pneumococcal vaccine Prevnar 20 given after consent obtained.  Get shingrix vaccine at a later date at your convenience   Screening for cancer: Colon cancer screening: Prior or last colon cancer screen: 2017, due repeat 10 years from that date  Prostate Cancer screening: The recommended prostate cancer screening test is a blood test called the prostate-specific antigen (PSA) test. PSA is a protein that is made in the prostate. As you age, your prostate naturally produces more PSA. Abnormally high PSA levels may be caused by: Prostate cancer. An enlarged prostate that is not caused by cancer (benign prostatic hyperplasia, or BPH). This condition is very common in older men. A prostate gland infection (prostatitis) or urinary tract infection. Certain medicines such as male hormones (like testosterone) or other medicines that raise testosterone levels. A rectal exam may be done as part of prostate cancer screening to help provide information about the size of your prostate gland. When a rectal exam is performed, it should be done after the PSA level is drawn to avoid any effect on the results.   Skin cancer screening: Check your skin regularly for new changes, growing lesions, or other lesions of concern Come in for evaluation if you have skin lesions of concern.   Lung cancer screening: If you have a greater than 20 pack year history of tobacco use, then you may qualify for lung cancer screening with a chest CT scan.   Please call your insurance company to inquire about coverage for this test.   Pancreatic cancer:  no current screening test is available or routinely recommended. (risk factors: smoking, overweight or obese, diabetes, chronic pancreatitis, work exposure - dry cleaning, metal working, 60yo>, M>F, African American, family hx/o, hereditary breast, ovarian, melanoma,  lynch, peutz-jeghers).  Symptoms: jaundice, dark urine, light color or greasy stools,  itchy skin, belly or back pain, weight loss, poor appetite, nausea, vomiting, liver enlargement, DVT/blood clots.   We currently don't have screenings for other cancers besides breast, cervical, colon, and lung cancers.  If you have a strong family history of cancer or have other cancer screening concerns, please let me know.  Genetic testing referral is an option for individuals with high cancer risk in the family.  There are some other cancer screenings in development currently.   Bone health: Get at least 150 minutes of aerobic exercise weekly Get weight bearing exercise at least once weekly Bone density test:  A bone density test is an imaging test that uses a type of X-ray to measure the amount of calcium  and other minerals in your bones. The test may be used to diagnose or screen you for a condition that causes weak or thin bones (osteoporosis), predict your risk for a broken bone (fracture), or determine how well your osteoporosis treatment is working. The bone density test is recommended for females 65 and older, or females or males <65 if certain risk factors such as thyroid disease, long term use of steroids such as for asthma or rheumatological issues, vitamin D deficiency, estrogen deficiency, family history of osteoporosis, self or family history of fragility fracture in first degree relative.    Heart health: Get at least 150 minutes of aerobic exercise weekly Limit alcohol It is important to maintain a healthy blood pressure and healthy cholesterol numbers  Heart disease screening: Screening for heart disease includes screening for blood pressure, fasting lipids, glucose/diabetes screening, BMI height to weight ratio, reviewed of smoking status, physical activity, and diet.    Goals include blood pressure 120/80 or less, maintaining a healthy lipid/cholesterol profile, preventing diabetes or keeping diabetes numbers under good control, not smoking or using tobacco products,  exercising most days per week or at least 150 minutes per week of exercise, and eating healthy variety of fruits and vegetables, healthy oils, and avoiding unhealthy food choices like fried food, fast food, high sugar and high cholesterol foods.    Other tests may possibly include EKG test, CT coronary calcium  score, echocardiogram, exercise treadmill stress test.      CT coronary calcium  test May 2024 showing calcium  score 17.9, aortic atherosclerosis, aortic valve calcification, emphysematous changes    Vascular disease screening: For higher risk individuals including smokers, diabetics, patients with known heart disease or high blood pressure, kidney disease, and others, screening for vascular disease or atherosclerosis of the arteries is available.  Examples may include carotid ultrasound, abdominal aortic ultrasound, ABI blood flow screening in the legs, thoracic aorta screening.   Medical care options: I recommend you continue to seek care here first for routine care.  We try really hard to have available appointments Monday through Friday daytime hours for sick visits, acute visits, and physicals.  Urgent care should be used for after hours and weekends for significant issues that cannot wait till the next day.  The emergency department should be used for significant potentially life-threatening emergencies.  The emergency department is expensive, can often have long wait times for less significant concerns, so try to utilize primary care, urgent care, or telemedicine when possible to avoid unnecessary trips to the emergency department.  Virtual visits and telemedicine have been introduced since the pandemic started in 2020, and can be convenient ways to receive medical care.  We offer virtual appointments as well  to assist you in a variety of options to seek medical care.   Legal  Take the time to do a last will and testament, Advanced Directives including Health Care Power of Attorney and  Living Will documents.  Don't leave your family with burdens that can be handled ahead of time.   Advanced Directives: I recommend you consider completing a Health Care Power of Attorney and Living Will.   These documents respect your wishes and help alleviate burdens on your loved ones if you were to become terminally ill or be in a position to need those documents enforced.    You can complete Advanced Directives yourself, have them notarized, then have copies made for our office, for you and for anybody you feel should have them in safe keeping.  Or, you can have an attorney prepare these documents.   If you haven't updated your Last Will and Testament in a while, it may be worthwhile having an attorney prepare these documents together and save on some costs.
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
## 2023-12-07 LAB — URINALYSIS, ROUTINE W REFLEX MICROSCOPIC
Specific Gravity, UA: 1.029 (ref 1.005–1.030)
Urobilinogen, Ur: 1 mg/dL (ref 0.2–1.0)
pH, UA: 5 (ref 5.0–7.5)

## 2023-12-07 LAB — COMPREHENSIVE METABOLIC PANEL WITH GFR
ALT: 14 IU/L (ref 0–44)
AST: 19 IU/L (ref 0–40)
Albumin: 4.2 g/dL (ref 3.8–4.9)
Alkaline Phosphatase: 111 IU/L (ref 47–123)
BUN/Creatinine Ratio: 15 (ref 10–24)
BUN: 16 mg/dL (ref 8–27)
Bilirubin Total: 0.4 mg/dL (ref 0.0–1.2)
CO2: 25 mmol/L (ref 20–29)
Calcium: 9.9 mg/dL (ref 8.6–10.2)
Chloride: 106 mmol/L (ref 96–106)
Creatinine, Ser: 1.08 mg/dL (ref 0.76–1.27)
Globulin, Total: 2.9 g/dL (ref 1.5–4.5)
Glucose: 78 mg/dL (ref 70–99)
Potassium: 4.7 mmol/L (ref 3.5–5.2)
Sodium: 143 mmol/L (ref 134–144)
Total Protein: 7.1 g/dL (ref 6.0–8.5)
eGFR: 79 mL/min/1.73 (ref >59)

## 2023-12-07 LAB — CBC WITH DIFFERENTIAL/PLATELET
Basophils Absolute: 0 x10E3/uL (ref 0.0–0.2)
Basos: 1 %
EOS (ABSOLUTE): 0.2 x10E3/uL (ref 0.0–0.4)
Eos: 4 %
Hematocrit: 50 % (ref 37.5–51.0)
Hemoglobin: 15.7 g/dL (ref 13.0–17.7)
Immature Grans (Abs): 0 x10E3/uL (ref 0.0–0.1)
Immature Granulocytes: 0 %
Lymphocytes Absolute: 2 x10E3/uL (ref 0.7–3.1)
Lymphs: 41 %
MCH: 26.7 pg (ref 26.6–33.0)
MCHC: 31.4 g/dL — ABNORMAL LOW (ref 31.5–35.7)
MCV: 85 fL (ref 79–97)
Monocytes Absolute: 0.4 x10E3/uL (ref 0.1–0.9)
Monocytes: 9 %
Neutrophils Absolute: 2.2 x10E3/uL (ref 1.4–7.0)
Neutrophils: 45 %
Platelets: 290 x10E3/uL (ref 150–450)
RBC: 5.89 x10E6/uL — ABNORMAL HIGH (ref 4.14–5.80)
RDW: 13.3 % (ref 11.6–15.4)
WBC: 4.8 x10E3/uL (ref 3.4–10.8)

## 2023-12-07 LAB — PSA: Prostate Specific Ag, Serum: 1.2 ng/mL (ref 0.0–4.0)

## 2023-12-07 LAB — LIPID PANEL
Cholesterol, Total: 235 mg/dL — AB (ref 100–199)
HDL: 65 mg/dL (ref >39)
LDL CALC COMMENT:: 3.6 ratio (ref 0.0–5.0)
LDL Chol Calc (NIH): 161 mg/dL — AB (ref 0–99)
Triglycerides: 53 mg/dL (ref 0–149)
VLDL Cholesterol Cal: 9 mg/dL (ref 5–40)

## 2023-12-07 LAB — TSH: TSH: 0.605 u[IU]/mL (ref 0.450–4.500)

## 2023-12-10 ENCOUNTER — Other Ambulatory Visit: Payer: Self-pay | Admitting: Medical

## 2023-12-10 ENCOUNTER — Ambulatory Visit: Payer: Self-pay | Admitting: Medical

## 2023-12-10 MED ORDER — LOSARTAN POTASSIUM 25 MG PO TABS: 25.0000 mg | ORAL_TABLET | Freq: Every day | ORAL | 3 refills | Status: AC

## 2023-12-10 MED ORDER — ROSUVASTATIN CALCIUM 10 MG PO TABS: 10.0000 mg | ORAL_TABLET | Freq: Every day | ORAL | 3 refills | Status: AC

## 2023-12-10 NOTE — Progress Notes (Signed)
 Results through MyChart
